# Patient Record
Sex: Male | Born: 1970 | Race: Black or African American | Hispanic: No | Marital: Married | Smoking: Former smoker
Health system: Southern US, Community
[De-identification: ages and names within clinical notes are randomized; demographics above are authoritative.]

## PROBLEM LIST (undated history)

## (undated) DIAGNOSIS — I10 Essential (primary) hypertension: Secondary | ICD-10-CM

## (undated) HISTORY — DX: Essential (primary) hypertension: I10

## (undated) HISTORY — PX: OTHER SURGICAL HISTORY: SHX169

---

## 1999-01-02 ENCOUNTER — Ambulatory Visit (HOSPITAL_COMMUNITY): Admission: RE | Admit: 1999-01-02 | Discharge: 1999-01-02 | Payer: Self-pay | Admitting: General Surgery

## 1999-01-02 ENCOUNTER — Encounter (HOSPITAL_BASED_OUTPATIENT_CLINIC_OR_DEPARTMENT_OTHER): Payer: Self-pay | Admitting: General Surgery

## 2005-09-08 ENCOUNTER — Observation Stay: Payer: Self-pay | Admitting: Otolaryngology

## 2006-10-18 ENCOUNTER — Emergency Department: Payer: Self-pay | Admitting: Unknown Physician Specialty

## 2007-11-26 ENCOUNTER — Ambulatory Visit: Payer: Self-pay | Admitting: Internal Medicine

## 2007-11-26 ENCOUNTER — Other Ambulatory Visit: Payer: Self-pay

## 2011-07-02 ENCOUNTER — Other Ambulatory Visit: Payer: Self-pay | Admitting: Internal Medicine

## 2011-07-02 MED ORDER — LISINOPRIL-HYDROCHLOROTHIAZIDE 20-25 MG PO TABS: 1.0000 | ORAL_TABLET | Freq: Every day | ORAL | Status: AC

## 2011-09-25 ENCOUNTER — Ambulatory Visit: Payer: Self-pay | Admitting: Internal Medicine

## 2011-10-14 ENCOUNTER — Encounter: Payer: Self-pay | Admitting: Internal Medicine

## 2011-10-14 ENCOUNTER — Ambulatory Visit (INDEPENDENT_AMBULATORY_CARE_PROVIDER_SITE_OTHER): Payer: Self-pay | Admitting: Internal Medicine

## 2011-10-14 VITALS — BP 160/92 | HR 72 | Temp 98.1°F | Ht 76.0 in | Wt 235.0 lb

## 2011-10-14 DIAGNOSIS — R635 Abnormal weight gain: Secondary | ICD-10-CM

## 2011-10-14 DIAGNOSIS — R634 Abnormal weight loss: Secondary | ICD-10-CM

## 2011-10-14 DIAGNOSIS — R5381 Other malaise: Secondary | ICD-10-CM

## 2011-10-14 DIAGNOSIS — E663 Overweight: Secondary | ICD-10-CM | POA: Insufficient documentation

## 2011-10-14 DIAGNOSIS — Z807 Family history of other malignant neoplasms of lymphoid, hematopoietic and related tissues: Secondary | ICD-10-CM | POA: Insufficient documentation

## 2011-10-14 DIAGNOSIS — R5383 Other fatigue: Secondary | ICD-10-CM

## 2011-10-14 DIAGNOSIS — I10 Essential (primary) hypertension: Secondary | ICD-10-CM

## 2011-10-14 LAB — CBC WITH DIFFERENTIAL/PLATELET
Basophils Absolute: 0 10*3/uL (ref 0.0–0.1)
Basophils Relative: 0.2 % (ref 0.0–3.0)
Eosinophils Absolute: 0.1 10*3/uL (ref 0.0–0.7)
Eosinophils Relative: 0.9 % (ref 0.0–5.0)
HCT: 46 % (ref 39.0–52.0)
Hemoglobin: 16.2 g/dL (ref 13.0–17.0)
Lymphocytes Relative: 36 % (ref 12.0–46.0)
Lymphs Abs: 2.5 10*3/uL (ref 0.7–4.0)
MCHC: 35.2 g/dL (ref 30.0–36.0)
MCV: 90.4 fl (ref 78.0–100.0)
Monocytes Absolute: 0.6 10*3/uL (ref 0.1–1.0)
Monocytes Relative: 8.8 % (ref 3.0–12.0)
Neutro Abs: 3.8 10*3/uL (ref 1.4–7.7)
Neutrophils Relative %: 54.1 % (ref 43.0–77.0)
Platelets: 191 10*3/uL (ref 150.0–400.0)
RBC: 5.09 Mil/uL (ref 4.22–5.81)
RDW: 12.8 % (ref 11.5–14.6)
WBC: 7 10*3/uL (ref 4.5–10.5)

## 2011-10-14 LAB — COMPREHENSIVE METABOLIC PANEL
ALT: 45 U/L (ref 0–53)
AST: 28 U/L (ref 0–37)
Albumin: 4.3 g/dL (ref 3.5–5.2)
Alkaline Phosphatase: 61 U/L (ref 39–117)
BUN: 12 mg/dL (ref 6–23)
CO2: 29 mEq/L (ref 19–32)
Calcium: 9 mg/dL (ref 8.4–10.5)
Chloride: 104 mEq/L (ref 96–112)
Creatinine, Ser: 1 mg/dL (ref 0.4–1.5)
GFR: 101.73 mL/min (ref >60.00)
Glucose, Bld: 85 mg/dL (ref 70–99)
Potassium: 3.8 mEq/L (ref 3.5–5.1)
Sodium: 140 mEq/L (ref 135–145)
Total Bilirubin: 1 mg/dL (ref 0.3–1.2)
Total Protein: 7.5 g/dL (ref 6.0–8.3)

## 2011-10-14 LAB — MICROALBUMIN / CREATININE URINE RATIO
Creatinine,U: 318.2 mg/dL
Microalb Creat Ratio: 0.3 mg/g (ref 0.0–30.0)
Microalb, Ur: 0.9 mg/dL (ref 0.0–1.9)

## 2011-10-14 LAB — LIPID PANEL
Cholesterol: 238 mg/dL — ABNORMAL HIGH (ref 0–200)
HDL: 44.9 mg/dL (ref >39.00)
Total CHOL/HDL Ratio: 5
Triglycerides: 133 mg/dL (ref 0.0–149.0)
VLDL: 26.6 mg/dL (ref 0.0–40.0)

## 2011-10-14 LAB — LDL CHOLESTEROL, DIRECT: Direct LDL: 166.6 mg/dL

## 2011-10-14 MED ORDER — AMLODIPINE BESYLATE 5 MG PO TABS: 5.0000 mg | ORAL_TABLET | Freq: Every day | ORAL | Status: DC

## 2011-10-14 NOTE — Assessment & Plan Note (Signed)
Chronic, with no meds in over 5 months.  Will start amlodipine pending review of CMET and urinalysis today.  Was taking lisinopril/hctz previously.

## 2011-10-14 NOTE — Assessment & Plan Note (Signed)
He has requested screening,  Will start with cbc and assessment of renal function.

## 2011-10-14 NOTE — Progress Notes (Signed)
Subjective:    Patient ID: Dylan Haynes, male    DOB: 1971-01-22, 41 y.o.   MRN: [id number]  HPI  Me. Crichlow is a healthy 41 yo AA male who has a history of hypertension and presents for one year followup.  He is a recent father of healthy 43 month old boy named Regulatory affairs officer.  Denies any recent home or work stressors.  Back to Selling  cars at [employer]  Not exercising due to work and baby schedule.   Has gained 20 lbs over the past 2 years.  He ran out of his medications in September.  Has no health insurance currently bc of the cost.  No new complaints,.  Past Medical History  Diagnosis Date  . Hypertension    Current Outpatient Prescriptions on File Prior to Visit  Medication Sig Dispense Refill  . lisinopril-hydrochlorothiazide (PRINZIDE,ZESTORETIC) 20-25 MG per tablet Take 1 tablet by mouth daily.  90 tablet  3    Review of Systems  Constitutional: Positive for unexpected weight change. Negative for fever, chills, diaphoresis, activity change, appetite change and fatigue.  HENT: Negative for hearing loss, ear pain, nosebleeds, congestion, sore throat, facial swelling, rhinorrhea, sneezing, drooling, mouth sores, trouble swallowing, neck pain, neck stiffness, dental problem, voice change, postnasal drip, sinus pressure, tinnitus and ear discharge.   Eyes: Negative for photophobia, pain, discharge, redness, itching and visual disturbance.  Respiratory: Negative for apnea, cough, choking, chest tightness, shortness of breath, wheezing and stridor.   Cardiovascular: Negative for chest pain, palpitations and leg swelling.  Gastrointestinal: Negative for nausea, vomiting, abdominal pain, diarrhea, constipation, blood in stool, abdominal distention, anal bleeding and rectal pain.  Genitourinary: Negative for dysuria, urgency, frequency, hematuria, flank pain, decreased urine volume, scrotal swelling, difficulty urinating and testicular pain.  Musculoskeletal: Negative for myalgias, back  pain, joint swelling, arthralgias and gait problem.  Skin: Negative for color change, rash and wound.  Neurological: Positive for tremors. Negative for dizziness, seizures, syncope, speech difficulty, weakness, light-headedness, numbness and headaches.  Psychiatric/Behavioral: Negative for suicidal ideas, hallucinations, behavioral problems, confusion, sleep disturbance, dysphoric mood, decreased concentration and agitation. The patient is not nervous/anxious.        BP 160/92  Pulse 72  Temp(Src) 98.1 F (36.7 C) (Oral)  Ht 6\' 4"  (1.93 m)  Wt 235 lb (106.595 kg)  BMI 28.61 kg/m2   Objective:   Physical Exam  Constitutional: He is oriented to person, place, and time.  HENT:  Head: Normocephalic and atraumatic.  Mouth/Throat: Oropharynx is clear and moist.  Eyes: Conjunctivae and EOM are normal.  Neck: Normal range of motion. Neck supple. No JVD present. No thyromegaly present.  Cardiovascular: Normal rate, regular rhythm and normal heart sounds.   Pulmonary/Chest: Effort normal and breath sounds normal. He has no wheezes. He has no rales.  Abdominal: Soft. Bowel sounds are normal. He exhibits no mass. There is no tenderness. There is no rebound.  Musculoskeletal: Normal range of motion. He exhibits no edema.  Neurological: He is alert and oriented to person, place, and time.  Skin: Skin is warm and dry.  Psychiatric: He has a normal mood and affect.      Assessment & Plan:   Hypertension Chronic, with no meds in over 5 months.  Will start amlodipine pending review of CMET and urinalysis today.  Was taking lisinopril/hctz previously.   Family history of multiple myeloma He has requested screening,  Will start with cbc and assessment of renal function.    Weight gaiin: secondary  to diet and inactivity .  Counselling given.  Updated Medication List Outpatient Encounter Prescriptions as of 10/14/2011  Medication Sig Dispense Refill  . amLODipine (NORVASC) 5 MG tablet Take 1  tablet (5 mg total) by mouth daily.  30 tablet  3  . lisinopril-hydrochlorothiazide (PRINZIDE,ZESTORETIC) 20-25 MG per tablet Take 1 tablet by mouth daily.  90 tablet  3

## 2011-10-14 NOTE — Patient Instructions (Addendum)
Try the EAS AdvantEdge proein shakes and the Atkins protein bars for breakfast and mid morning snacks  Recheck bp in 2 weeks ,  Goal 130/80 or less

## 2012-02-24 ENCOUNTER — Other Ambulatory Visit: Payer: Self-pay | Admitting: Internal Medicine

## 2012-05-06 ENCOUNTER — Encounter: Payer: Self-pay | Admitting: Internal Medicine

## 2012-07-20 ENCOUNTER — Other Ambulatory Visit: Payer: Self-pay | Admitting: Internal Medicine

## 2012-08-20 ENCOUNTER — Other Ambulatory Visit: Payer: Self-pay | Admitting: Internal Medicine

## 2012-08-20 NOTE — Telephone Encounter (Signed)
Patient needs an appointment

## 2012-09-24 ENCOUNTER — Other Ambulatory Visit: Payer: Self-pay | Admitting: Internal Medicine

## 2012-09-25 NOTE — Telephone Encounter (Signed)
Needs offive visit prior to additional refills

## 2012-11-13 ENCOUNTER — Other Ambulatory Visit: Payer: Self-pay

## 2012-11-17 ENCOUNTER — Other Ambulatory Visit: Payer: Self-pay | Admitting: Internal Medicine

## 2012-12-29 ENCOUNTER — Ambulatory Visit (INDEPENDENT_AMBULATORY_CARE_PROVIDER_SITE_OTHER): Payer: BC Managed Care – PPO | Admitting: Internal Medicine

## 2012-12-29 ENCOUNTER — Encounter: Payer: Self-pay | Admitting: Internal Medicine

## 2012-12-29 VITALS — BP 130/68 | HR 70 | Temp 97.8°F | Resp 18 | Ht 76.0 in | Wt 240.2 lb

## 2012-12-29 DIAGNOSIS — E785 Hyperlipidemia, unspecified: Secondary | ICD-10-CM

## 2012-12-29 DIAGNOSIS — Z0000 Encounter for general adult medical examination without abnormal findings: Secondary | ICD-10-CM

## 2012-12-29 DIAGNOSIS — I10 Essential (primary) hypertension: Secondary | ICD-10-CM

## 2012-12-29 LAB — LIPID PANEL
Cholesterol: 244 mg/dL — ABNORMAL HIGH (ref 0–200)
Total CHOL/HDL Ratio: 6
Triglycerides: 91 mg/dL (ref 0.0–149.0)

## 2012-12-29 LAB — CBC WITH DIFFERENTIAL/PLATELET
Basophils Relative: 0.3 % (ref 0.0–3.0)
Eosinophils Relative: 0.7 % (ref 0.0–5.0)
Hemoglobin: 15.6 g/dL (ref 13.0–17.0)
Lymphocytes Relative: 36.9 % (ref 12.0–46.0)
MCHC: 34.5 g/dL (ref 30.0–36.0)
Neutro Abs: 3.4 10*3/uL (ref 1.4–7.7)
Neutrophils Relative %: 53.9 % (ref 43.0–77.0)
RBC: 5.09 Mil/uL (ref 4.22–5.81)
WBC: 6.3 10*3/uL (ref 4.5–10.5)

## 2012-12-29 LAB — COMPREHENSIVE METABOLIC PANEL
Albumin: 4.4 g/dL (ref 3.5–5.2)
BUN: 13 mg/dL (ref 6–23)
CO2: 30 mEq/L (ref 19–32)
Calcium: 9.4 mg/dL (ref 8.4–10.5)
Chloride: 102 mEq/L (ref 96–112)
GFR: 85.63 mL/min (ref >60.00)
Glucose, Bld: 72 mg/dL (ref 70–99)
Potassium: 4.2 mEq/L (ref 3.5–5.1)
Sodium: 140 mEq/L (ref 135–145)
Total Protein: 7.7 g/dL (ref 6.0–8.3)

## 2012-12-29 MED ORDER — AMLODIPINE BESYLATE 5 MG PO TABS: 5.0000 mg | ORAL_TABLET | Freq: Every day | ORAL | Status: DC

## 2012-12-29 NOTE — Patient Instructions (Addendum)
We will call you with your labs   For more fiber try  Atkins bars  Mission low carb whole wheat tortillas

## 2012-12-29 NOTE — Progress Notes (Signed)
Patient ID: Dylan Haynes, male   DOB: 09-08-1971, 42 y.o.   MRN: [id number]  The patient is here for his annual male physical examination and management of other chronic and acute problems.   The risk factors are reflected in the social history.  The roster of all physicians providing medical care to patient - is listed in the Snapshot section of the chart.  Activities of daily living:  The patient is 100% independent in all ADLs: dressing, toileting, feeding as well as independent mobility  Home safety : The patient has smoke detectors in the home. He wears seatbelts.  There are no firearms at home. There is no violence in the home.   There is no risks for hepatitis, STDs or HIV. There is no   history of blood transfusion. There is no travel history to infectious disease endemic areas of the world.  The patient has seen their dentist in the last six month and  their eye doctor in the last year.  They do not  have excessive sun exposure. They have seen a dermatoloigist in the last year. Discussed the need for sun protection: hats, long sleeves and use of sunscreen if there is significant sun exposure.   Diet: the importance of a healthy diet is discussed. They do have a healthy diet.  The benefits of regular aerobic exercise were discussed. He exercises a minimum of 30 minutes  5 days per week. Depression screen: there are no signs or vegative symptoms of depression- irritability, change in appetite, anhedonia, sadness/tearfullness.  The following portions of the patient's history were reviewed and updated as appropriate: allergies, current medications, past family history, past medical history,  past surgical history, past social history  and problem list.  Visual acuity was not assessed per patient preference since he has regular follow up with his ophthalmologist. Hearing and body mass index were assessed and reviewed.   During the course of the visit the patient was educated and  counseled about appropriate screening and preventive services including :  nutrition counseling, colorectal cancer screening, and recommended immunizations.    Past Medical History  Diagnosis Date  . Hypertension     Review of Systems  Patient denies headache, fevers, malaise, unintentional weight loss, skin rash, eye pain, sinus congestion and sinus pain, sore throat, dysphagia,  hemoptysis , cough, dyspnea, wheezing, chest pain, palpitations, orthopnea, edema, abdominal pain, nausea, melena, diarrhea, constipation, flank pain, dysuria, hematuria, urinary  Frequency, nocturia, numbness, tingling, seizures,  Focal weakness, Loss of consciousness,  Tremor, insomnia, depression, anxiety, and suicidal ideation.    Objective  BP 130/68  Pulse 70  Temp(Src) 97.8 F (36.6 C) (Oral)  Resp 18  Ht 6\' 4"  (1.93 m)  Wt 240 lb 4 oz (108.977 kg)  BMI 29.26 kg/m2  SpO2 97%  General Appearance:    Alert, cooperative, no distress, appears stated age  Head:    Normocephalic, without obvious abnormality, atraumatic  Eyes:    PERRL, conjunctiva/corneas clear, EOM's intact, fundi    benign, both eyes       Ears:    Normal TM's and external ear canals, both ears  Nose:   Nares normal, septum midline, mucosa normal, no drainage   or sinus tenderness  Throat:   Lips, mucosa, and tongue normal; teeth and gums normal  Neck:   Supple, symmetrical, trachea midline, no adenopathy;       thyroid:  No enlargement/tenderness/nodules; no carotid   bruit or JVD  Back:  Symmetric, no curvature, ROM normal, no CVA tenderness  Lungs:     Clear to auscultation bilaterally, respirations unlabored  Chest wall:    No tenderness or deformity  Heart:    Regular rate and rhythm, S1 and S2 normal, no murmur, rub   or gallop  Abdomen:     Soft, non-tender, bowel sounds active all four quadrants,    no masses, no organomegaly  Genitalia:    Normal male without lesion, discharge or tenderness  Extremities:    Extremities normal, atraumatic, no cyanosis or edema  Pulses:   2+ and symmetric all extremities  Skin:   Skin color, texture, turgor normal, no rashes or lesions  Lymph nodes:   Cervical, supraclavicular, and axillary nodes normal  Neurologic:   CNII-XII intact. Normal strength, sensation and reflexes      throughout   Assessment and plan  Routine general medical examination at a health care facility Annual male exam was done including testicular exam.     Hypertension Well controlled on current regimen. Renal function stable, no changes today.  Other and unspecified hyperlipidemia recommending statin therapy if diet and exercise do not lower LDL to < 150 in 6 months.    Updated Medication List Outpatient Encounter Prescriptions as of 12/29/2012  Medication Sig Dispense Refill  . amLODipine (NORVASC) 5 MG tablet Take 1 tablet (5 mg total) by mouth daily.  90 tablet  3  . [DISCONTINUED] amLODipine (NORVASC) 5 MG tablet TAKE 1 TABLET BY MOUTH DAILY.  30 tablet  0   No facility-administered encounter medications on file as of 12/29/2012.

## 2012-12-30 ENCOUNTER — Encounter: Payer: Self-pay | Admitting: Internal Medicine

## 2012-12-30 DIAGNOSIS — E785 Hyperlipidemia, unspecified: Secondary | ICD-10-CM | POA: Insufficient documentation

## 2012-12-30 DIAGNOSIS — Z0000 Encounter for general adult medical examination without abnormal findings: Secondary | ICD-10-CM | POA: Insufficient documentation

## 2012-12-30 NOTE — Assessment & Plan Note (Signed)
Annual male exam was done including testicular exam.

## 2012-12-30 NOTE — Assessment & Plan Note (Signed)
recommending statin therapy if diet and exercise do not lower LDL to < 150 in 6 months.

## 2012-12-30 NOTE — Assessment & Plan Note (Signed)
Well controlled on current regimen. Renal function stable, no changes today. 

## 2013-01-03 ENCOUNTER — Encounter: Payer: Self-pay | Admitting: General Practice

## 2013-08-04 ENCOUNTER — Other Ambulatory Visit: Payer: Self-pay

## 2013-11-18 ENCOUNTER — Encounter: Payer: Self-pay | Admitting: Internal Medicine

## 2013-11-18 ENCOUNTER — Ambulatory Visit (INDEPENDENT_AMBULATORY_CARE_PROVIDER_SITE_OTHER): Payer: BC Managed Care – PPO | Admitting: Internal Medicine

## 2013-11-18 VITALS — BP 142/100 | HR 84 | Temp 98.2°F | Resp 18 | Wt 246.5 lb

## 2013-11-18 DIAGNOSIS — I10 Essential (primary) hypertension: Secondary | ICD-10-CM

## 2013-11-18 DIAGNOSIS — E785 Hyperlipidemia, unspecified: Secondary | ICD-10-CM

## 2013-11-18 DIAGNOSIS — R635 Abnormal weight gain: Secondary | ICD-10-CM

## 2013-11-18 MED ORDER — AMLODIPINE BESYLATE 10 MG PO TABS: 10.0000 mg | ORAL_TABLET | Freq: Every day | ORAL | Status: DC

## 2013-11-18 NOTE — Progress Notes (Signed)
Pre-visit discussion using our clinic review tool. No additional management support is needed unless otherwise documented below in the visit note.  

## 2013-11-18 NOTE — Patient Instructions (Signed)
I agree with your plan to drop weight to 225  (BMI 27)  Increase the amlodipine to 10 mg daily for now.   Return in one month for fasting labs   This is  One version of a  "Low GI"  Diet:  It will still lower your blood sugars and allow you to lose 4 to 8  lbs  per month if you follow it carefully.  Your goal with exercise is a minimum of 30 minutes of aerobic exercise 5 days per week (Walking does not count once it becomes easy!)    All of the foods can be found at grocery stores and in bulk at [employer].  The Atkins protein bars and shakes are available in more varieties at [employer], [employer] and [place].     7 AM Breakfast:  Choose from the following:  Low carbohydrate Protein  Shakes (I recommend the EAS AdvantEdge "Carb Control" shakes  Or the low carb shakes by Atkins.    2.5 carbs   Arnold's "Sandwhich Thin"toasted  w/ peanut butter (no jelly: about 20 net carbs)  "Bagel Thin" with cream cheese and salmon: about 20 carbs   a scrambled egg/bacon/cheese burrito made with Mission's "carb balance" whole wheat tortilla  (about 10 net carbs )   Avoid cereal and bananas, oatmeal and cream of wheat and grits. They are loaded with carbohydrates!   10 AM: high protein snack  Protein bar by Atkins (the snack size, under 200 cal, usually < 6 net carbs).    A stick of cheese:  Around 1 carb,  100 cal     Dannon Light n Fit [place] Yogurt  (80 cal, 8 carbs)  Other so called "protein bars" and Greek yogurts tend to be loaded with carbohydrates.  Remember, in food advertising, the word "energy" is synonymous for " carbohydrate."  Lunch:   A Sandwich using the bread choices listed, Can use any  Eggs,  lunchmeat, grilled meat or canned tuna), avocado, regular mayo/mustard  and cheese.  A Salad using blue cheese, ranch,  Goddess or vinagrette,  No croutons or "confetti" and no "candied nuts" but regular nuts OK.   No pretzels or chips.  Pickles and miniature sweet peppers are a good low carb  alternative that provide a "crunch"  The bread is the only source of carbohydrate in a sandwich and  can be decreased by trying some of these alternatives to traditional loaf bread  Joseph's makes a pita bread and a flat bread that are 50 cal and 4 net carbs available at [place] and [place].  This can be toasted to use with hummous as well  Toufayan makes a low carb flatbread that's 100 cal and 9 net carbs available at [employer] and [employer]'s makes 2 sizes of  Low carb whole wheat tortilla  (The large one is 210 cal and 6 net carbs) Avoid "Low fat dressings, as well as Barry Brunner and [place] dressings They are loaded with sugar!   3 PM/ Mid day  Snack:  Consider  1 ounce of  almonds, walnuts, pistachios, pecans, peanuts,  Macadamia nuts or a nut medley.  Avoid "granola"; the dried cranberries and raisins are loaded with carbohydrates. Mixed nuts as long as there are no raisins,  cranberries or dried fruit.     6 PM  Dinner:     Meat/fowl/fish with a green salad, and either broccoli, cauliflower, green beans, spinach, brussel sprouts or  Lima beans. DO  NOT BREAD THE PROTEIN!!      There is a low carb pasta by Dreamfield's that is acceptable and tastes great: only 5 digestible carbs/serving.( All grocery stores but BJs carry it )  Try Hurley Cisco Angelo's chicken piccata or chicken or eggplant parm over low carb pasta.(Lowes and BJs)   Marjory Lies Sanchez's "Carnitas" (pulled pork, no sauce,  0 carbs) or his beef pot roast to make a dinner burrito (at [employer]J's)  Pesto over low carb pasta (bj's sells a good quality pesto in the center refrigerated section of the deli   Whole wheat pasta is still full of digestible carbs and  Not as low in glycemic index as Dreamfield's.   Brown rice is still rice,  So skip the rice and noodles if you eat [place] or [place] (or at least limit to 1/2 cup)  9 PM snack :   Breyer's "low carb" fudgsicle or  ice cream bar (Carb Smart line), or  Weight Watcher's ice cream bar ,  or another "no sugar added" ice cream;  a serving of fresh berries/cherries with whipped cream   Cheese or DANNON'S [place] YOGURT  Avoid bananas, pineapple, grapes  and watermelon on a regular basis because they are high in sugar.  THINK OF THEM AS DESSERT  Remember that snack Substitutions should be less than 10 NET carbs per serving and meals < 20 carbs. Remember to subtract fiber grams to get the "net carbs."

## 2013-11-18 NOTE — Assessment & Plan Note (Signed)
I have addressed  BMI  Of 30 and recommended wt loss of 10% of body weigh over the next 6 months using a low glycemic index diet and regular exercise a minimum of 5 days per week.

## 2013-11-20 NOTE — Assessment & Plan Note (Addendum)
Using last year's lipids and the Framingham risk calculator,  his 10 year risk of coronary artery disease is 13%.  Repeat lipids will be done and statin recommended if still indicated.    Lab Results  Component Value Date   CHOL 244* 12/29/2012   HDL 42.10 12/29/2012   LDLDIRECT 188.4 12/29/2012   TRIG 91.0 12/29/2012   CHOLHDL 6 12/29/2012

## 2013-11-20 NOTE — Assessment & Plan Note (Signed)
Well controlled on current regimen. Renal function due.,   no changes today. 

## 2013-11-20 NOTE — Progress Notes (Signed)
Patient ID: Dylan Haynes, male   DOB: 10/26/1970, 43 y.o.   MRN: [id number]  Patient Active Problem List   Diagnosis Date Noted  . Routine general medical examination at a health care facility 12/30/2012  . Other and unspecified hyperlipidemia 12/30/2012  . Family history of multiple myeloma 10/14/2011  . Weight gain 10/14/2011  . Hypertension     Subjective:  CC:   Chief Complaint  Patient presents with  . Follow-up    Discuss family HX heart disease, BP and cholesterol    HPI:   Dylan Haynes is a 43 y.o. male who presents for 6 month follow up on hypertension, hyperlipidemia and weight gain.  He is not exercising due to work and home responsibilities. .  He has gained 5 lbs in the past year .    Past Medical History  Diagnosis Date  . Hypertension     No past surgical history on file.     The following portions of the patient's history were reviewed and updated as appropriate: Allergies, current medications, and problem list.    Review of Systems:   Patient denies headache, fevers, malaise, unintentional weight loss, skin rash, eye pain, sinus congestion and sinus pain, sore throat, dysphagia,  hemoptysis , cough, dyspnea, wheezing, chest pain, palpitations, orthopnea, edema, abdominal pain, nausea, melena, diarrhea, constipation, flank pain, dysuria, hematuria, urinary  Frequency, nocturia, numbness, tingling, seizures,  Focal weakness, Loss of consciousness,  Tremor, insomnia, depression, anxiety, and suicidal ideation.     History   Social History  . Marital Status: Divorced    Spouse Name: N/A    Number of Children: N/A  . Years of Education: N/A   Occupational History  . Not on file.   Social History Main Topics  . Smoking status: Former Smoker    Types: Cigars  . Smokeless tobacco: Not on file  . Alcohol Use: Not on file  . Drug Use: Not on file  . Sexual Activity: Not on file   Other Topics Concern  . Not on file   Social History  Narrative  . No narrative on file    Objective:  Filed Vitals:   11/18/13 1042  BP: 142/100  Pulse: 84  Temp: 98.2 F (36.8 C)  Resp: 18     General appearance: alert, cooperative and appears stated age Ears: normal TM's and external ear canals both ears Throat: lips, mucosa, and tongue normal; teeth and gums normal Neck: no adenopathy, no carotid bruit, supple, symmetrical, trachea midline and thyroid not enlarged, symmetric, no tenderness/mass/nodules Back: symmetric, no curvature. ROM normal. No CVA tenderness. Lungs: clear to auscultation bilaterally Heart: regular rate and rhythm, S1, S2 normal, no murmur, click, rub or gallop Abdomen: soft, non-tender; bowel sounds normal; no masses,  no organomegaly Pulses: 2+ and symmetric Skin: Skin color, texture, turgor normal. No rashes or lesions Lymph nodes: Cervical, supraclavicular, and axillary nodes normal.  Assessment and Plan:  Weight gain I have addressed  BMI  Of 30 and recommended wt loss of 10% of body weight over the next 6 months using a low glycemic index diet and regular exercise a minimum of 5 days per week.    Other and unspecified hyperlipidemia Using last year's lipids and the Framingham risk calculator,  his 10 year risk of coronary artery disease is 13%.  Repeat lipids will be done and statin recommended if still indicated.    Lab Results  Component Value Date   CHOL 244* 12/29/2012   HDL  42.10 12/29/2012   LDLDIRECT 188.4 12/29/2012   TRIG 91.0 12/29/2012   CHOLHDL 6 12/29/2012    Hypertension Well controlled on current regimen. Renal function due, no changes today.   Updated Medication List Outpatient Encounter Prescriptions as of 11/18/2013  Medication Sig  . amLODipine (NORVASC) 10 MG tablet Take 1 tablet (10 mg total) by mouth daily.  . [DISCONTINUED] amLODipine (NORVASC) 5 MG tablet Take 1 tablet (5 mg total) by mouth daily.     Orders Placed This Encounter  Procedures  . Comprehensive metabolic  panel  . Lipid panel  . Microalbumin / creatinine urine ratio  . TSH    No Follow-up on file.       

## 2013-11-21 ENCOUNTER — Telehealth: Payer: Self-pay | Admitting: Internal Medicine

## 2013-11-21 NOTE — Telephone Encounter (Signed)
Relevant patient education assigned to patient using Emmi. ° °

## 2013-12-16 ENCOUNTER — Other Ambulatory Visit: Payer: BC Managed Care – PPO

## 2014-11-27 ENCOUNTER — Encounter: Payer: Self-pay | Admitting: Internal Medicine

## 2015-01-01 ENCOUNTER — Telehealth: Payer: Self-pay

## 2015-01-01 DIAGNOSIS — I10 Essential (primary) hypertension: Secondary | ICD-10-CM

## 2015-01-01 MED ORDER — AMLODIPINE BESYLATE 10 MG PO TABS: 10.0000 mg | ORAL_TABLET | Freq: Every day | ORAL | Status: DC

## 2015-01-01 NOTE — Telephone Encounter (Signed)
OK to refill BP medication for 30 days, patient not seen in 1 year, has an appointment on 01/22/15 for CPE.

## 2015-01-01 NOTE — Telephone Encounter (Signed)
Refill one 30 days only.  rx sent

## 2015-01-01 NOTE — Telephone Encounter (Signed)
The patient's wife called hoping to get a refill on the pt's blood pressure medication to last until his cpe apt in a few weeks.

## 2015-01-22 ENCOUNTER — Encounter: Payer: Self-pay | Admitting: Internal Medicine

## 2015-01-22 ENCOUNTER — Ambulatory Visit (INDEPENDENT_AMBULATORY_CARE_PROVIDER_SITE_OTHER): Payer: BLUE CROSS/BLUE SHIELD | Admitting: Internal Medicine

## 2015-01-22 VITALS — BP 136/100 | HR 82 | Temp 98.5°F | Resp 16 | Ht 76.0 in | Wt 243.8 lb

## 2015-01-22 DIAGNOSIS — Z125 Encounter for screening for malignant neoplasm of prostate: Secondary | ICD-10-CM

## 2015-01-22 DIAGNOSIS — R030 Elevated blood-pressure reading, without diagnosis of hypertension: Secondary | ICD-10-CM

## 2015-01-22 DIAGNOSIS — R5383 Other fatigue: Secondary | ICD-10-CM | POA: Diagnosis not present

## 2015-01-22 DIAGNOSIS — E785 Hyperlipidemia, unspecified: Secondary | ICD-10-CM | POA: Diagnosis not present

## 2015-01-22 DIAGNOSIS — IMO0001 Reserved for inherently not codable concepts without codable children: Secondary | ICD-10-CM

## 2015-01-22 DIAGNOSIS — N486 Induration penis plastica: Secondary | ICD-10-CM | POA: Diagnosis not present

## 2015-01-22 DIAGNOSIS — I10 Essential (primary) hypertension: Secondary | ICD-10-CM

## 2015-01-22 DIAGNOSIS — Z0000 Encounter for general adult medical examination without abnormal findings: Secondary | ICD-10-CM | POA: Diagnosis not present

## 2015-01-22 DIAGNOSIS — Z23 Encounter for immunization: Secondary | ICD-10-CM

## 2015-01-22 LAB — COMPREHENSIVE METABOLIC PANEL
ALK PHOS: 70 U/L (ref 39–117)
ALT: 28 U/L (ref 0–53)
AST: 19 U/L (ref 0–37)
Albumin: 4.8 g/dL (ref 3.5–5.2)
BUN: 13 mg/dL (ref 6–23)
CALCIUM: 9.9 mg/dL (ref 8.4–10.5)
CHLORIDE: 103 meq/L (ref 96–112)
CO2: 29 mEq/L (ref 19–32)
Creatinine, Ser: 1.16 mg/dL (ref 0.40–1.50)
GFR: 87.36 mL/min (ref >60.00)
Glucose, Bld: 85 mg/dL (ref 70–99)
Potassium: 4 mEq/L (ref 3.5–5.1)
Sodium: 138 mEq/L (ref 135–145)
Total Bilirubin: 1.1 mg/dL (ref 0.2–1.2)
Total Protein: 7.8 g/dL (ref 6.0–8.3)

## 2015-01-22 LAB — MICROALBUMIN / CREATININE URINE RATIO
Creatinine,U: 628.5 mg/dL
MICROALB UR: 1.8 mg/dL (ref 0.0–1.9)
MICROALB/CREAT RATIO: 0.3 mg/g (ref 0.0–30.0)

## 2015-01-22 LAB — CBC WITH DIFFERENTIAL/PLATELET
BASOS PCT: 0.4 % (ref 0.0–3.0)
Basophils Absolute: 0 10*3/uL (ref 0.0–0.1)
EOS PCT: 0.8 % (ref 0.0–5.0)
Eosinophils Absolute: 0.1 10*3/uL (ref 0.0–0.7)
HCT: 49.7 % (ref 39.0–52.0)
Hemoglobin: 16.9 g/dL (ref 13.0–17.0)
LYMPHS PCT: 36.3 % (ref 12.0–46.0)
Lymphs Abs: 2.7 10*3/uL (ref 0.7–4.0)
MCHC: 34 g/dL (ref 30.0–36.0)
MCV: 88.4 fl (ref 78.0–100.0)
MONOS PCT: 8.5 % (ref 3.0–12.0)
Monocytes Absolute: 0.6 10*3/uL (ref 0.1–1.0)
NEUTROS PCT: 54 % (ref 43.0–77.0)
Neutro Abs: 4.1 10*3/uL (ref 1.4–7.7)
Platelets: 197 10*3/uL (ref 150.0–400.0)
RBC: 5.61 Mil/uL (ref 4.22–5.81)
RDW: 13.3 % (ref 11.5–15.5)
WBC: 7.6 10*3/uL (ref 4.0–10.5)

## 2015-01-22 LAB — LIPID PANEL
CHOL/HDL RATIO: 6
Cholesterol: 258 mg/dL — ABNORMAL HIGH (ref 0–200)
HDL: 45.2 mg/dL (ref >39.00)
LDL Cholesterol: 193 mg/dL — ABNORMAL HIGH (ref 0–99)
NONHDL: 212.8
TRIGLYCERIDES: 99 mg/dL (ref 0.0–149.0)
VLDL: 19.8 mg/dL (ref 0.0–40.0)

## 2015-01-22 LAB — TSH: TSH: 1.22 u[IU]/mL (ref 0.35–4.50)

## 2015-01-22 LAB — PSA: PSA: 1.52 ng/mL (ref 0.10–4.00)

## 2015-01-22 NOTE — Progress Notes (Signed)
Pre-visit discussion using our clinic review tool. No additional management support is needed unless otherwise documented below in the visit note.  

## 2015-01-22 NOTE — Patient Instructions (Addendum)
I am referring you to [facility] for your penile problems,  which is called Peyronie's Disease  It is NOT CANCER ..it is a chronic condition that causes curvature of the shaft and may be due to prior trauma    Your blood pressure is very elevated today,  It may be white coat hypertension.  Please recheck it once eery few weeks anda semd ne the readings  It should be < 130/80 .    Health Maintenance A healthy lifestyle and preventative care can promote health and wellness.  Maintain regular health, dental, and eye exams.  Eat a healthy diet. Foods like vegetables, fruits, whole grains, low-fat dairy products, and lean protein foods contain the nutrients you need and are low in calories. Decrease your intake of foods high in solid fats, added sugars, and salt. Get information about a proper diet from your health care provider, if necessary.  Regular physical exercise is one of the most important things you can do for your health. Most adults should get at least 150 minutes of moderate-intensity exercise (any activity that increases your heart rate and causes you to sweat) each week. In addition, most adults need muscle-strengthening exercises on 2 or more days a week.   Maintain a healthy weight. The body mass index (BMI) is a screening tool to identify possible weight problems. It provides an estimate of body fat based on height and weight. Your health care provider can find your BMI and can help you achieve or maintain a healthy weight. For males 20 years and older:  A BMI below 18.5 is considered underweight.  A BMI of 18.5 to 24.9 is normal.  A BMI of 25 to 29.9 is considered overweight.  A BMI of 30 and above is considered obese.  Maintain normal blood lipids and cholesterol by exercising and minimizing your intake of saturated fat. Eat a balanced diet with plenty of fruits and vegetables. Blood tests for lipids and cholesterol should begin at age 54 and be repeated every 5  years. If your lipid or cholesterol levels are high, you are over age 23, or you are at high risk for heart disease, you may need your cholesterol levels checked more frequently.Ongoing high lipid and cholesterol levels should be treated with medicines if diet and exercise are not working.  If you smoke, find out from your health care provider how to quit. If you do not use tobacco, do not start.  Lung cancer screening is recommended for adults aged 27-80 years who are at high risk for developing lung cancer because of a history of smoking. A yearly low-dose CT scan of the lungs is recommended for people who have at least a 30-pack-year history of smoking and are current smokers or have quit within the past 15 years. A pack year of smoking is smoking an average of 1 pack of cigarettes a day for 1 year (for example, a 30-pack-year history of smoking could mean smoking 1 pack a day for 30 years or 2 packs a day for 15 years). Yearly screening should continue until the smoker has stopped smoking for at least 15 years. Yearly screening should be stopped for people who develop a health problem that would prevent them from having lung cancer treatment.  If you choose to drink alcohol, do not have more than 2 drinks per day. One drink is considered to be 12 oz (360 mL) of beer, 5 oz (150 mL) of wine, or 1.5 oz (45 mL) of liquor.  Avoid the use of street drugs. Do not share needles with anyone. Ask for help if you need support or instructions about stopping the use of drugs.  High blood pressure causes heart disease and increases the risk of stroke. Blood pressure should be checked at least every 1-2 years. Ongoing high blood pressure should be treated with medicines if weight loss and exercise are not effective.  If you are 40-69 years old, ask your health care provider if you should take aspirin to prevent heart disease.  Diabetes screening involves taking a blood sample to check your fasting blood sugar  level. This should be done once every 3 years after age 55 if you are at a normal weight and without risk factors for diabetes. Testing should be considered at a younger age or be carried out more frequently if you are overweight and have at least 1 risk factor for diabetes.  Colorectal cancer can be detected and often prevented. Most routine colorectal cancer screening begins at the age of 24 and continues through age 32. However, your health care provider may recommend screening at an earlier age if you have risk factors for colon cancer. On a yearly basis, your health care provider may provide home test kits to check for hidden blood in the stool. A small camera at the end of a tube may be used to directly examine the colon (sigmoidoscopy or colonoscopy) to detect the earliest forms of colorectal cancer. Talk to your health care provider about this at age 27 when routine screening begins. A direct exam of the colon should be repeated every 5-10 years through age 60, unless early forms of precancerous polyps or small growths are found.  People who are at an increased risk for hepatitis B should be screened for this virus. You are considered at high risk for hepatitis B if:  You were born in a country where hepatitis B occurs often. Talk with your health care provider about which countries are considered high risk.  Your parents were born in a high-risk country and you have not received a shot to protect against hepatitis B (hepatitis B vaccine).  You have HIV or AIDS.  You use needles to inject street drugs.  You live with, or have sex with, someone who has hepatitis B.  You are a man who has sex with other men (MSM).  You get hemodialysis treatment.  You take certain medicines for conditions like cancer, organ transplantation, and autoimmune conditions.  Hepatitis C blood testing is recommended for all people born from [birth year] through 1967 and any individual with known risk factors for  hepatitis C.  Healthy men should no longer receive prostate-specific antigen (PSA) blood tests as part of routine cancer screening. Talk to your health care provider about prostate cancer screening.  Testicular cancer screening is not recommended for adolescents or adult males who have no symptoms. Screening includes self-exam, a health care provider exam, and other screening tests. Consult with your health care provider about any symptoms you have or any concerns you have about testicular cancer.  Practice safe sex. Use condoms and avoid high-risk sexual practices to reduce the spread of sexually transmitted infections (STIs).  You should be screened for STIs, including gonorrhea and chlamydia if:  You are sexually active and are younger than 24 years.  You are older than 24 years, and your health care provider tells you that you are at risk for this type of infection.  Your sexual activity has changed since you  were last screened, and you are at an increased risk for chlamydia or gonorrhea. Ask your health care provider if you are at risk.  If you are at risk of being infected with HIV, it is recommended that you take a prescription medicine daily to prevent HIV infection. This is called pre-exposure prophylaxis (PrEP). You are considered at risk if:  You are a man who has sex with other men (MSM).  You are a heterosexual man who is sexually active with multiple partners.  You take drugs by injection.  You are sexually active with a partner who has HIV.  Talk with your health care provider about whether you are at high risk of being infected with HIV. If you choose to begin PrEP, you should first be tested for HIV. You should then be tested every 3 months for as long as you are taking PrEP.  Use sunscreen. Apply sunscreen liberally and repeatedly throughout the day. You should seek shade when your shadow is shorter than you. Protect yourself by wearing long sleeves, pants, a  wide-brimmed hat, and sunglasses year round whenever you are outdoors.  Tell your health care provider of new moles or changes in moles, especially if there is a change in shape or color. Also, tell your health care provider if a mole is larger than the size of a pencil eraser.  A one-time screening for abdominal aortic aneurysm (AAA) and surgical repair of large AAAs by ultrasound is recommended for men aged 61-75 years who are current or former smokers.  Stay current with your vaccines (immunizations). Document Released: 03/13/2008 Document Revised: 09/20/2013 Document Reviewed: 02/10/2011 [facility] Patient Information 2015 [place], [place]. This information is not intended to replace advice given to you by your health care provider. Make sure you discuss any questions you have with your health care provider.

## 2015-01-22 NOTE — Progress Notes (Signed)
Patient ID: Dylan Haynes, male   DOB: [birth year], 44 y.o.   MRN: [id number]    The patient is here for his annual  physical examination and management of other chronic and acute problems. He has been exercising less due to the increasing responsibilities at work and at home with 4 growing boys.   He has noticed that his penile shaft has taken on a slight curved orientation to the right with erections.  It is not interfering with intercourse,  Is not painful, and not associated with urinary glow changes.  He recalls that he nay have been struck in the groin once or twice with a basketball in his younger days while playing, but other than that has no history of penile trauma and no history of STD.    The risk factors are reflected in the social history.  The roster of all physicians providing medical care to patient - is listed in the Snapshot section of the chart.  Activities of daily living:  The patient is 100% independent in all ADLs: dressing, toileting, feeding as well as independent mobility  Home safety : The patient has smoke detectors in the home. He wears seatbelts.  There are no firearms at home. There is no violence in the home.   There is no risks for hepatitis, STDs or HIV. There is no   history of blood transfusion. There is no travel history to infectious disease endemic areas of the world.  The patient has seen their dentist in the last six month and  their eye doctor in the last year.  They do not  have excessive sun exposure. They have seen a dermatoloigist in the last year. Discussed the need for sun protection: hats, long sleeves and use of sunscreen if there is significant sun exposure.   Diet: the importance of a healthy diet is discussed. They do have a healthy diet.  The benefits of regular aerobic exercise were discussed. He exercises about 2 times per  week. Depression screen: there are no signs or vegative symptoms of depression- irritability, change in appetite,  anhedonia, sadness/tearfullness.  The following portions of the patient's history were reviewed and updated as appropriate: allergies, current medications, past family history, past medical history,  past surgical history, past social history  and problem list.  Visual acuity was not assessed per patient preference since he has regular follow up with his ophthalmologist. Hearing and body mass index were assessed and reviewed.   During the course of the visit the patient was educated and counseled about appropriate screening and preventive services including :  nutrition counseling, colorectal cancer screening, and recommended immunizations.    Review of Systems:  Patient denies headache, fevers, malaise, unintentional weight loss, skin rash, eye pain, sinus congestion and sinus pain, sore throat, dysphagia,  hemoptysis , cough, dyspnea, wheezing, chest pain, palpitations, orthopnea, edema, abdominal pain, nausea, melena, diarrhea, constipation, flank pain, dysuria, hematuria, urinary  Frequency, nocturia, numbness, tingling, seizures,  Focal weakness, Loss of consciousness,  Tremor, insomnia, depression, anxiety, and suicidal ideation.     Objective:  BP 136/100 mmHg  Pulse 82  Temp(Src) 98.5 F (36.9 C) (Oral)  Resp 16  Ht 6\' 4"  (1.93 m)  Wt 243 lb 12 oz (110.564 kg)  BMI 29.68 kg/m2  SpO2 97%  General Appearance:    Alert, cooperative, no distress, appears stated age  Head:    Normocephalic, without obvious abnormality, atraumatic  Eyes:    PERRL, conjunctiva/corneas clear, EOM's intact, fundi  benign, both eyes       Ears:    Normal TM's and external ear canals, both ears  Nose:   Nares normal, septum midline, mucosa normal, no drainage   or sinus tenderness  Throat:   Lips, mucosa, and tongue normal; teeth and gums normal  Neck:   Supple, symmetrical, trachea midline, no adenopathy;       thyroid:  No enlargement/tenderness/nodules; no carotid   bruit or JVD  Back:      Symmetric, no curvature, ROM normal, no CVA tenderness  Lungs:     Clear to auscultation bilaterally, respirations unlabored  Chest wall:    No tenderness or deformity  Heart:    Regular rate and rhythm, S1 and S2 normal, no murmur, rub   or gallop  Abdomen:     Soft, non-tender, bowel sounds active all four quadrants,    no masses, no organomegaly  Genitalia:    Normal male without lesion, discharge or tenderness  Rectal:    Normal tone, normal prostate, no masses or tenderness;   guaiac negative stool  Extremities:   Extremities normal, atraumatic, no cyanosis or edema  Pulses:   2+ and symmetric all extremities  Skin:   Skin color, texture, turgor normal, no rashes or lesions  Lymph nodes:   Cervical, supraclavicular, and axillary nodes normal  Neurologic:   CNII-XII intact. Normal strength, sensation and reflexes      throughout   Assessment and Plan:  Problem List Items Addressed This Visit    Hypertension    Patient has not been see in a year for hypertension follow up and his last assessment of renal function was a year ago.  Advised her that [facility] is q 6 month follow up with renal function assessment.  Since his BP is elevated today I have asked him to have BP checked 3 times over the next month and report readings to me       Relevant Medications   atorvastatin (LIPITOR) 20 MG tablet   Routine general medical examination at a health care facility - Primary    Annual wellness  exam was done as well as a comprehensive physical exam and management of acute and chronic conditions .  During the course of the visit the patient was educated and counseled about appropriate screening and preventive services including :  diabetes screening, lipid analysis with projected  10 year  risk for CAD , nutrition counseling, colorectal cancer screening, and recommended immunizations.  Printed recommendations for health maintenance screenings was given.       Hyperlipidemia    Based on current  lipid profile, the risk of clinically significant CAD is 12 over the  next 10 years, using the Framingham risk calculator. The [employer] recommends starting patients aged 33 or higher on moderate intensity statin therapy for LDL between 70-189 and 10 yr risk of CAD > 7.5% ;  I am recommending statin therapy with lipitor 20 mg daily and return for repeat labs in 6 weeks  Lab Results  Component Value Date   CHOL 258* 01/22/2015   HDL 45.20 01/22/2015   LDLCALC 193* 01/22/2015   LDLDIRECT 188.4 12/29/2012   TRIG 99.0 01/22/2015   CHOLHDL 6 01/22/2015         Relevant Medications   atorvastatin (LIPITOR) 20 MG tablet   Other Relevant Orders   Lipid panel (Completed)   Peyronie disease    Suggested by patient's observation.  Diagnosis discussed and reassurance provided  that CA is not part of the history or prognosis, but that progressive changes leading to discomfort are posbbile.  Referral to [facility] offered.       Relevant Orders   Ambulatory referral to Urology    Other Visit Diagnoses    Screening for prostate cancer        Relevant Orders    PSA (Completed)    Other fatigue        Relevant Orders    CBC with Differential/Platelet (Completed)    TSH (Completed)    HTN, white coat        Relevant Medications    atorvastatin (LIPITOR) 20 MG tablet    Other Relevant Orders    Comprehensive metabolic panel (Completed)    Microalbumin / creatinine urine ratio (Completed)    Need for prophylactic vaccination with combined diphtheria-tetanus-pertussis (DTP) vaccine        Relevant Orders    Tdap vaccine greater than or equal to 7yo IM (Completed)

## 2015-01-23 ENCOUNTER — Encounter: Payer: Self-pay | Admitting: Internal Medicine

## 2015-01-23 MED ORDER — ATORVASTATIN CALCIUM 20 MG PO TABS: 20.0000 mg | ORAL_TABLET | Freq: Every day | ORAL | Status: DC

## 2015-01-23 NOTE — Assessment & Plan Note (Addendum)
Based on current lipid profile, the risk of clinically significant CAD is 12 over the  next 10 years, using the Framingham risk calculator. The [employer] recommends starting patients aged 46 or higher on moderate intensity statin therapy for LDL between 70-189 and 10 yr risk of CAD > 7.5% ;  I am recommending statin therapy with lipitor 20 mg daily and return for repeat labs in 6 weeks  Lab Results  Component Value Date   CHOL 258* 01/22/2015   HDL 45.20 01/22/2015   LDLCALC 193* 01/22/2015   LDLDIRECT 188.4 12/29/2012   TRIG 99.0 01/22/2015   CHOLHDL 6 01/22/2015

## 2015-01-23 NOTE — Assessment & Plan Note (Signed)

## 2015-01-23 NOTE — Assessment & Plan Note (Addendum)
Patient has not been see in a year for hypertension follow up and his last assessment of renal function was a year ago.  Advised her that [facility] is q 6 month follow up with renal function assessment.  Since his BP is elevated today I have asked him to have BP checked 3 times over the next month and report readings to me

## 2015-01-23 NOTE — Assessment & Plan Note (Signed)
Suggested by patient's observation.  Diagnosis discussed and reassurance provided that CA is not part of the history or prognosis, but that progressive changes leading to discomfort are posbbile.  Referral to [facility] offered.

## 2015-01-27 NOTE — Addendum Note (Signed)
Addended by: Crecencio Mc on: 01/27/2015 03:27 PM   Modules accepted: Level of Service

## 2015-02-06 ENCOUNTER — Other Ambulatory Visit: Payer: Self-pay | Admitting: Internal Medicine

## 2015-02-16 NOTE — Telephone Encounter (Signed)
Mailed unread message to pt  

## 2015-07-25 ENCOUNTER — Ambulatory Visit: Payer: BLUE CROSS/BLUE SHIELD | Admitting: Internal Medicine

## 2015-07-28 NOTE — Assessment & Plan Note (Signed)
Patient failed to keep scheduled appointment and will be charged a no show fee.   

## 2015-07-28 NOTE — Progress Notes (Signed)
Patient failed to keep scheduled appointment and will be charged a no show fee.   

## 2015-08-13 ENCOUNTER — Other Ambulatory Visit: Payer: Self-pay | Admitting: Internal Medicine

## 2016-09-15 ENCOUNTER — Other Ambulatory Visit: Payer: Self-pay | Admitting: Internal Medicine

## 2016-09-16 NOTE — Telephone Encounter (Signed)
Pt last refill for Norvasc was on 08/13/15. Pt last labs and last OV was on 01/22/15 and no showed appt on 07/25/15. PT has scheduled appt for this Thursday 09/18/16. Ok to refill Norvasc?

## 2016-09-18 ENCOUNTER — Encounter: Payer: Self-pay | Admitting: Internal Medicine

## 2016-09-18 ENCOUNTER — Ambulatory Visit (INDEPENDENT_AMBULATORY_CARE_PROVIDER_SITE_OTHER): Payer: 59 | Admitting: Internal Medicine

## 2016-09-18 VITALS — BP 160/98 | HR 68 | Temp 98.0°F | Resp 16 | Ht 75.75 in | Wt 251.5 lb

## 2016-09-18 DIAGNOSIS — R635 Abnormal weight gain: Secondary | ICD-10-CM

## 2016-09-18 DIAGNOSIS — E785 Hyperlipidemia, unspecified: Secondary | ICD-10-CM | POA: Diagnosis not present

## 2016-09-18 DIAGNOSIS — E7800 Pure hypercholesterolemia, unspecified: Secondary | ICD-10-CM | POA: Diagnosis not present

## 2016-09-18 DIAGNOSIS — E559 Vitamin D deficiency, unspecified: Secondary | ICD-10-CM

## 2016-09-18 DIAGNOSIS — Z0000 Encounter for general adult medical examination without abnormal findings: Secondary | ICD-10-CM

## 2016-09-18 DIAGNOSIS — Z125 Encounter for screening for malignant neoplasm of prostate: Secondary | ICD-10-CM | POA: Diagnosis not present

## 2016-09-18 DIAGNOSIS — R5383 Other fatigue: Secondary | ICD-10-CM | POA: Diagnosis not present

## 2016-09-18 DIAGNOSIS — I10 Essential (primary) hypertension: Secondary | ICD-10-CM | POA: Diagnosis not present

## 2016-09-18 LAB — MICROALBUMIN / CREATININE URINE RATIO
CREATININE, U: 361.2 mg/dL
MICROALB UR: 2.7 mg/dL — AB (ref 0.0–1.9)
MICROALB/CREAT RATIO: 0.7 mg/g (ref 0.0–30.0)

## 2016-09-18 LAB — URINALYSIS, ROUTINE W REFLEX MICROSCOPIC
Bilirubin Urine: NEGATIVE
Ketones, ur: NEGATIVE
Leukocytes, UA: NEGATIVE
Nitrite: NEGATIVE
PH: 5.5 (ref 5.0–8.0)
Specific Gravity, Urine: >=1.03 — AB (ref 1.000–1.030)
TOTAL PROTEIN, URINE-UPE24: NEGATIVE
Urine Glucose: NEGATIVE
Urobilinogen, UA: 0.2 (ref 0.0–1.0)

## 2016-09-18 LAB — CBC WITH DIFFERENTIAL/PLATELET
Basophils Absolute: 0 10*3/uL (ref 0.0–0.1)
Basophils Relative: 0.4 % (ref 0.0–3.0)
EOS ABS: 0.1 10*3/uL (ref 0.0–0.7)
Eosinophils Relative: 1.2 % (ref 0.0–5.0)
HCT: 46.4 % (ref 39.0–52.0)
Hemoglobin: 15.8 g/dL (ref 13.0–17.0)
LYMPHS ABS: 2.5 10*3/uL (ref 0.7–4.0)
Lymphocytes Relative: 33.8 % (ref 12.0–46.0)
MCHC: 34.1 g/dL (ref 30.0–36.0)
MCV: 89.2 fl (ref 78.0–100.0)
Monocytes Absolute: 0.6 10*3/uL (ref 0.1–1.0)
Monocytes Relative: 8.3 % (ref 3.0–12.0)
NEUTROS PCT: 56.3 % (ref 43.0–77.0)
Neutro Abs: 4.2 10*3/uL (ref 1.4–7.7)
Platelets: 185 10*3/uL (ref 150.0–400.0)
RBC: 5.2 Mil/uL (ref 4.22–5.81)
RDW: 13.3 % (ref 11.5–15.5)
WBC: 7.5 10*3/uL (ref 4.0–10.5)

## 2016-09-18 LAB — LIPID PANEL
CHOLESTEROL: 231 mg/dL — AB (ref 0–200)
HDL: 44.4 mg/dL (ref >39.00)
LDL Cholesterol: 167 mg/dL — ABNORMAL HIGH (ref 0–99)
NonHDL: 186.96
Total CHOL/HDL Ratio: 5
Triglycerides: 102 mg/dL (ref 0.0–149.0)
VLDL: 20.4 mg/dL (ref 0.0–40.0)

## 2016-09-18 LAB — COMPREHENSIVE METABOLIC PANEL
ALT: 39 U/L (ref 0–53)
AST: 20 U/L (ref 0–37)
Albumin: 4.5 g/dL (ref 3.5–5.2)
Alkaline Phosphatase: 68 U/L (ref 39–117)
BILIRUBIN TOTAL: 0.8 mg/dL (ref 0.2–1.2)
BUN: 12 mg/dL (ref 6–23)
CALCIUM: 9.3 mg/dL (ref 8.4–10.5)
CO2: 30 mEq/L (ref 19–32)
Chloride: 105 mEq/L (ref 96–112)
Creatinine, Ser: 1.14 mg/dL (ref 0.40–1.50)
GFR: 88.49 mL/min (ref >60.00)
Glucose, Bld: 85 mg/dL (ref 70–99)
Potassium: 4.2 mEq/L (ref 3.5–5.1)
Sodium: 141 mEq/L (ref 135–145)
Total Protein: 7.2 g/dL (ref 6.0–8.3)

## 2016-09-18 LAB — PSA: PSA: 1.51 ng/mL (ref 0.10–4.00)

## 2016-09-18 LAB — TSH: TSH: 1.5 u[IU]/mL (ref 0.35–4.50)

## 2016-09-18 LAB — VITAMIN D 25 HYDROXY (VIT D DEFICIENCY, FRACTURES): VITD: 10.63 ng/mL — ABNORMAL LOW (ref 30.00–100.00)

## 2016-09-18 NOTE — Patient Instructions (Addendum)
I want you to resume your blood pressure medication,  Regular exercise,  And lose 10 lbs!   I Recommended Low Carb high Protein premixed Shakes or the protein bars for breakfast:   Premier Protein  Atkins Advantage Muscle Milk EAS AdvantEdge   All of these are available at [employer], [place]  And taste good    Try the KIND low glycemic index  Bars or the Atkins bars   This is  Dr. Lupita Dawn  example of a  "Low GI"  Diet:  It will allow you to lose 4 to 8  lbs  per month if you follow it carefully.  Your goal with exercise is a minimum of 30 minutes of aerobic exercise 5 days per week (Walking does not count once it becomes easy!)    All of the foods can be found at grocery stores and in bulk at [employer].  The Atkins protein bars and shakes are available in more varieties at [employer], [employer] and [place].     7 AM Breakfast:  Choose from the following:  Low carbohydrate Protein  Shakes (I recommend the  Premier Protein chocolate shakes,  EAS AdvantEdge "Carb Control" shakes  Or the Atkins shakes all are under 3 net carbs)     a scrambled egg/bacon/cheese burrito made with [employer]'s "carb balance" whole wheat tortilla  (about 10 net carbs )  Regulatory affairs officer (basically a quiche without the pastry crust) that is eaten cold and very convenient way to get your eggs.  8 carbs)  If you make your own protein shakes, avoid bananas and pineapple,  And use low carb greek yogurt or original /unsweetened almond or soy milk    Avoid cereal and bananas, oatmeal and cream of wheat and grits. They are loaded with carbohydrates!   10 AM: high protein snack:  Protein bar by Atkins (the snack size, under 200 cal, usually < 6 net carbs).    A stick of cheese:  Around 1 carb,  100 cal     Dannon Light n Fit [place] Yogurt  (80 cal, 8 carbs)  Other so called "protein bars" and Greek yogurts tend to be loaded with carbohydrates.  Remember, in food  advertising, the word "energy" is synonymous for " carbohydrate."  Lunch:   A Sandwich using the bread choices listed, Can use any  Eggs,  lunchmeat, grilled meat or canned tuna), avocado, regular mayo/mustard  and cheese.  A Salad using blue cheese, ranch,  Goddess or vinagrette,  Avoid taco shells, croutons or "confetti" and no "candied nuts" but regular nuts OK.   No pretzels, nabs  or chips.  Pickles and miniature sweet peppers are a good low carb alternative that provide a "crunch"  The bread is the only source of carbohydrate in a sandwich and  can be decreased by trying some of the attached alternatives to traditional loaf bread   Avoid "Low fat dressings, as well as Cherry dressings They are loaded with sugar!   3 PM/ Mid day  Snack:  Consider  1 ounce of  almonds, walnuts, pistachios, pecans, peanuts,  Macadamia nuts or a nut medley.  Avoid "granola and granola bars "  Mixed nuts are ok in moderation as long as there are no raisins,  cranberries or dried fruit.   KIND bars are OK if you get the low glycemic index variety   Try the prosciutto/mozzarella cheese sticks by  [employer]  In Forensic psychologist /backery section   High protein      6 PM  Dinner:     Meat/fowl/fish with a green salad, and either broccoli, cauliflower, green beans, spinach, brussel sprouts or  Lima beans. DO NOT BREAD THE PROTEIN!!      There is a low carb pasta by Dreamfield's that is acceptable and tastes great: only 5 digestible carbs/serving.( All grocery stores but BJs carry it ) Several ready made meals are available low carb:   Try Michel Angelo's chicken piccata or chicken or eggplant parm over low carb pasta.(Lowes and BJs)   Marjory Lies Sanchez's "Carnitas" (pulled pork, no sauce,  0 carbs) or his beef pot roast to make a dinner burrito (at [employer])  Pesto over low carb pasta (bj's sells a good quality pesto in the center refrigerated section of the deli   Try satueeing  Cheral Marker with mushroooms as a  good side   [employer] makes a mashed cauliflower that tastes like mashed potatoes  Whole wheat pasta is still full of digestible carbs and  Not as low in glycemic index as Dreamfield's.   Brown rice is still rice,  So skip the rice and noodles if you eat [place] or [place] (or at least limit to 1/2 cup)  9 PM snack :   Breyer's "low carb" fudgsicle or  ice cream bar (Carb Smart line), or  Weight Watcher's ice cream bar , or another "no sugar added" ice cream;  a serving of fresh berries/cherries with whipped cream   Cheese or DANNON'S LlGHT N FIT GREEK YOGURT  8 ounces of [place] unsweetened almond/cococunut milk    Treat yourself to a parfait made with whipped cream blueberiies, walnuts and vanilla greek yogurt  Avoid bananas, pineapple, grapes  and watermelon on a regular basis because they are high in sugar.  THINK OF THEM AS DESSERT  Remember that snack Substitutions should be less than 10 NET carbs per serving and meals < 20 carbs. Remember to subtract fiber grams to get the "net carbs."

## 2016-09-18 NOTE — Progress Notes (Signed)
Pre-visit discussion using our clinic review tool. No additional management support is needed unless otherwise documented below in the visit note.  

## 2016-09-18 NOTE — Progress Notes (Signed)
Patient ID: Dylan Haynes, male    DOB: 1971-08-05  Age: 45 y.o. MRN: [id number]  The patient is here for annual  wellness examination but has been lost to follow up since April 2016 and has not been taking his medication for hypertension and hyperlipidemia.  He is accompanied by his wife Dylan Haynes .they are expecting their 5th child together, all boys   Since his last visit , his mother died of complications of a CVA  At age 64 . Died  on Aug 29, 2023 After 15 days in the hospital last month,  Had tobacco abuse and hypertension, atherosclerosis      The risk factors are reflected in the social history.  The roster of all physicians providing medical care to patient - is listed in the Snapshot section of the chart.  Home safety : The patient has smoke detectors in the home. They wear seatbelts.  There are no firearms at home. There is no violence in the home.   There is no risks for hepatitis, STDs or HIV. There is no   history of blood transfusion. They have no travel history to infectious disease endemic areas of the world.  The patient has seen their dentist in the last six month. They have  not  seen their eye doctor in the last year.   They do not  have excessive sun exposure. Discussed the need for sun protection: hats, long sleeves and use of sunscreen if there is significant sun exposure.   Diet: the importance of a healthy diet is discussed. They do not have a healthy diet.  The benefits of regular aerobic exercise were discussed. He is not exercising.   Depression screen: there are no signs or vegative symptoms of depression- irritability, change in appetite, anhedonia, sadness/tearfullness.  The following portions of the patient's history were reviewed and updated as appropriate: allergies, current medications, past family history, past medical history,  past surgical history, past social history  and problem list.  Visual acuity was not assessed per patient preference since she has  regular follow up with her ophthalmologist. Hearing and body mass index were assessed and reviewed.   During the course of the visit the patient was educated and counseled about appropriate screening and preventive services including : fall prevention , diabetes screening, nutrition counseling, colorectal cancer screening, and recommended immunizations.    CC: The primary encounter diagnosis was Essential hypertension. Diagnoses of Hyperlipidemia LDL goal <100, Prostate cancer screening, Vitamin D deficiency, Weight gain, Fatigue, unspecified type, Pure hypercholesterolemia, and Encounter for preventive health examination were also pertinent to this visit.  History [name] has a past medical history of Hypertension.   He has no past surgical history on file.   His family history includes Cancer (age of onset: 31) in his maternal grandmother; Heart disease in his paternal uncle; Hypertension in his mother; Stroke in his mother.He reports that he has quit smoking. His smoking use included Cigars. He has never used smokeless tobacco. He reports that he does not drink alcohol or use drugs.  Outpatient Medications Prior to Visit  Medication Sig Dispense Refill  . amLODipine (NORVASC) 10 MG tablet Take 1 tablet (10 mg total) by mouth daily. 30 tablet 0  . amLODipine (NORVASC) 10 MG tablet TAKE ONE TABLET BY MOUTH ONCE DAILY 30 tablet 0  . atorvastatin (LIPITOR) 20 MG tablet Take 1 tablet (20 mg total) by mouth daily. 90 tablet 3   No facility-administered medications prior to visit.  Review of Systems  Objective:  BP (!) 160/98   Pulse 68   Temp 98 F (36.7 C) (Oral)   Resp 16   Ht 6' 3.75" (1.924 m)   Wt 251 lb 8 oz (114.1 kg)   SpO2 97%   BMI 30.82 kg/m   Physical Exam    Assessment & Plan:   Problem List Items Addressed This Visit    Encounter for preventive health examination    Annual comprehensive preventive exam was done as well as an evaluation and management of acute  and chronic conditions .  During the course of the visit the patient was educated and counseled about appropriate screening and preventive services including :  diabetes screening, lipid analysis with projected  10 year  risk for CAD , nutrition counseling, prostate and colorectal cancer screening, and recommended immunizations.  Printed recommendations for health maintenance screenings was given.       Hyperlipidemia    Using the Framingham risk calculator,  his 10 year risk of coronary artery disease is 15%. Statin therapy will be recommended.       Hypertension - Primary    Uncontrolled due to medication lapse,  Resume amlodipine and return in one week for bp check .  Will add an ARB if additional therapy is needed , based on minimal proteinuria.       Relevant Orders   Comprehensive metabolic panel (Completed)   Lipid panel (Completed)   Microalbumin / creatinine urine ratio (Completed)   Weight gain    I have addressed  BMI and recommended wt loss of 10% of body weigh over the next 6 months using a low glycemic index diet and regular exercise a minimum of 5 days per week.         Other Visit Diagnoses    Hyperlipidemia LDL goal <100       Prostate cancer screening       Relevant Orders   PSA (Completed)   Vitamin D deficiency       Relevant Orders   VITAMIN D 25 Hydroxy (Vit-D Deficiency, Fractures) (Completed)   Fatigue, unspecified type       Relevant Orders   HIV antibody (Completed)   TSH (Completed)   CBC with Differential/Platelet (Completed)   Urinalysis, Routine w reflex microscopic (Completed)      I have discontinued Mr. Karczewski's atorvastatin. I am also having him maintain his amLODipine.  No orders of the defined types were placed in this encounter.   Medications Discontinued During This Encounter  Medication Reason  . amLODipine (NORVASC) 10 MG tablet Duplicate  . atorvastatin (LIPITOR) 20 MG tablet One time medication    Follow-up: Return in about  1 week (around 09/25/2016), or BP CHECK .   Crecencio Mc, MD

## 2016-09-19 LAB — HIV ANTIBODY (ROUTINE TESTING W REFLEX): HIV: NONREACTIVE

## 2016-09-21 ENCOUNTER — Other Ambulatory Visit: Payer: Self-pay | Admitting: Internal Medicine

## 2016-09-21 DIAGNOSIS — E559 Vitamin D deficiency, unspecified: Secondary | ICD-10-CM | POA: Insufficient documentation

## 2016-09-21 MED ORDER — ERGOCALCIFEROL 1.25 MG (50000 UT) PO CAPS: 50000.0000 [IU] | ORAL_CAPSULE | ORAL | 3 refills | Status: DC

## 2016-09-21 NOTE — Assessment & Plan Note (Signed)
I have addressed  BMI and recommended wt loss of 10% of body weigh over the next 6 months using a low glycemic index diet and regular exercise a minimum of 5 days per week.   

## 2016-09-21 NOTE — Addendum Note (Signed)
Addended by: Crecencio Mc on: 09/21/2016 01:47 PM   Modules accepted: Orders

## 2016-09-21 NOTE — Assessment & Plan Note (Signed)
Uncontrolled due to medication lapse,  Resume amlodipine and return in one week for bp check .  Will add an ARB if additional therapy is needed , based on minimal proteinuria.

## 2016-09-21 NOTE — Assessment & Plan Note (Signed)

## 2016-09-21 NOTE — Assessment & Plan Note (Signed)
Using the Framingham risk calculator,  his 10 year risk of coronary artery disease is 15%. Statin therapy will be recommended.

## 2016-09-25 ENCOUNTER — Ambulatory Visit (INDEPENDENT_AMBULATORY_CARE_PROVIDER_SITE_OTHER): Payer: 59

## 2016-09-25 ENCOUNTER — Other Ambulatory Visit: Payer: Self-pay | Admitting: Internal Medicine

## 2016-09-25 VITALS — BP 148/94 | HR 74

## 2016-09-25 DIAGNOSIS — I10 Essential (primary) hypertension: Secondary | ICD-10-CM

## 2016-09-25 MED ORDER — HYDROCHLOROTHIAZIDE 25 MG PO TABS: 25.0000 mg | ORAL_TABLET | Freq: Every day | ORAL | 3 refills | Status: DC

## 2016-09-25 NOTE — Progress Notes (Signed)
Adding hctz w5 mg daily,  Take daily in the am as soon as he wakes up

## 2016-09-25 NOTE — Progress Notes (Signed)
Patient advised and verbalized an understanding  

## 2016-09-25 NOTE — Progress Notes (Signed)
Patient comes in for 1 week blood pressure check after re- starting amlodipine. Blood pressure was checked several time due to being elevated while patient was in office.  Patient was advised Dr Derrel Nip will add additional blood pressure medication .   Please advise.

## 2016-10-01 ENCOUNTER — Telehealth: Payer: Self-pay | Admitting: Internal Medicine

## 2016-10-01 NOTE — Telephone Encounter (Signed)
Pt spouse Beckie Busing called and wanted to know when pt needed to come back for an appt. Please advise, thank you!  Call @ [phone]

## 2016-10-02 NOTE — Telephone Encounter (Signed)
Tell him to read his my chart,  Or read it to him

## 2016-10-02 NOTE — Telephone Encounter (Signed)
Spoke with patient and they were unsure when to f/u on last bp check will try to schedule appt with Dr. Derrel Nip in a month and pt requesting lab results   Please advise

## 2016-10-03 NOTE — Telephone Encounter (Signed)
Patient was informed of results.  Patient understood and no questions, comments, or concerns at this time.  

## 2016-10-22 ENCOUNTER — Other Ambulatory Visit: Payer: Self-pay | Admitting: Internal Medicine

## 2016-10-22 NOTE — Telephone Encounter (Signed)
PT Norvasc shows discontinued on 09/18/16 but pt is requesting refills? BP was last checked in house on 09/25/16 148/94.  Pt last labs and OV were on 09/18/16. OK to refill?

## 2016-10-22 NOTE — Telephone Encounter (Signed)
Pt spouse called and needs a refill on this medication. He is completely out.   [facility] Egan), [place] [facility] [place]  Call pt @ [phone]

## 2016-10-22 NOTE — Telephone Encounter (Signed)
Pt has requested a update on medication. Pt contact [phone]

## 2016-10-23 ENCOUNTER — Other Ambulatory Visit: Payer: Self-pay | Admitting: Radiology

## 2016-10-23 DIAGNOSIS — I10 Essential (primary) hypertension: Secondary | ICD-10-CM

## 2016-10-23 MED ORDER — AMLODIPINE BESYLATE 10 MG PO TABS: 10.0000 mg | ORAL_TABLET | Freq: Every day | ORAL | 0 refills | Status: DC

## 2016-10-23 NOTE — Telephone Encounter (Signed)
Refilled 90 days , MyChart message sent

## 2016-11-06 ENCOUNTER — Encounter: Payer: Self-pay | Admitting: Internal Medicine

## 2016-11-06 ENCOUNTER — Ambulatory Visit (INDEPENDENT_AMBULATORY_CARE_PROVIDER_SITE_OTHER): Payer: 59 | Admitting: Internal Medicine

## 2016-11-06 VITALS — BP 140/96 | HR 81 | Resp 16 | Wt 243.0 lb

## 2016-11-06 DIAGNOSIS — E7800 Pure hypercholesterolemia, unspecified: Secondary | ICD-10-CM | POA: Diagnosis not present

## 2016-11-06 DIAGNOSIS — I10 Essential (primary) hypertension: Secondary | ICD-10-CM | POA: Diagnosis not present

## 2016-11-06 DIAGNOSIS — E663 Overweight: Secondary | ICD-10-CM | POA: Diagnosis not present

## 2016-11-06 DIAGNOSIS — E785 Hyperlipidemia, unspecified: Secondary | ICD-10-CM

## 2016-11-06 MED ORDER — LOSARTAN POTASSIUM-HCTZ 50-12.5 MG PO TABS: 1.0000 | ORAL_TABLET | Freq: Every day | ORAL | 3 refills | Status: DC

## 2016-11-06 NOTE — Progress Notes (Signed)
Subjective:  Patient ID: Dylan Haynes, male    DOB: [birth year]  Age: 46 y.o. MRN: [id number]  CC: The primary encounter diagnosis was Hyperlipidemia LDL goal <100. Diagnoses of Overweight (BMI 25.0-29.9), Essential hypertension, and Pure hypercholesterolemia were also pertinent to this visit.  HPI Dylan Haynes presents for follow up on obesity , new onset, and hypertension  Has lost 8 lbs by reducing portion size,  Doing more walking  Daily.    Patient is taking his medications as prescribed and notes no adverse effects.  Home BP readings have been done about once per week and are  generally < 130/80 .  he is avoiding added salt in her diet and walking regularly about 4 times per week for exercise  .  Outpatient Medications Prior to Visit  Medication Sig Dispense Refill  . amLODipine (NORVASC) 10 MG tablet TAKE ONE TABLET BY MOUTH ONCE DAILY 90 tablet 1  . ergocalciferol (DRISDOL) 50000 units capsule Take 1 capsule (50,000 Units total) by mouth once a week. 4 capsule 3  . amLODipine (NORVASC) 10 MG tablet Take 1 tablet (10 mg total) by mouth daily. 30 tablet 0  . hydrochlorothiazide (HYDRODIURIL) 25 MG tablet Take 1 tablet (25 mg total) by mouth daily. 90 tablet 3   No facility-administered medications prior to visit.     Review of Systems;  Patient denies headache, fevers, malaise, unintentional weight loss, skin rash, eye pain, sinus congestion and sinus pain, sore throat, dysphagia,  hemoptysis , cough, dyspnea, wheezing, chest pain, palpitations, orthopnea, edema, abdominal pain, nausea, melena, diarrhea, constipation, flank pain, dysuria, hematuria, urinary  Frequency, nocturia, numbness, tingling, seizures,  Focal weakness, Loss of consciousness,  Tremor, insomnia, depression, anxiety, and suicidal ideation.      Objective:  BP (!) 140/96   Pulse 81   Resp 16   Wt 243 lb (110.2 kg)   SpO2 93%   BMI 29.77 kg/m   BP Readings from Last 3 Encounters:  11/06/16 (!)  140/96  09/25/16 (!) 148/94  09/18/16 (!) 160/98    Wt Readings from Last 3 Encounters:  11/06/16 243 lb (110.2 kg)  09/18/16 251 lb 8 oz (114.1 kg)  01/22/15 243 lb 12 oz (110.6 kg)    General appearance: alert, cooperative and appears stated age Ears: normal TM's and external ear canals both ears Throat: lips, mucosa, and tongue normal; teeth and gums normal Neck: no adenopathy, no carotid bruit, supple, symmetrical, trachea midline and thyroid not enlarged, symmetric, no tenderness/mass/nodules Back: symmetric, no curvature. ROM normal. No CVA tenderness. Lungs: clear to auscultation bilaterally Heart: regular rate and rhythm, S1, S2 normal, no murmur, click, rub or gallop Abdomen: soft, non-tender; bowel sounds normal; no masses,  no organomegaly Pulses: 2+ and symmetric Skin: Skin color, texture, turgor normal. No rashes or lesions Lymph nodes: Cervical, supraclavicular, and axillary nodes normal.  No results found for: HGBA1C  Lab Results  Component Value Date   CREATININE 1.14 09/18/2016   CREATININE 1.16 01/22/2015   CREATININE 1.2 12/29/2012    Lab Results  Component Value Date   WBC 7.5 09/18/2016   HGB 15.8 09/18/2016   HCT 46.4 09/18/2016   PLT 185.0 09/18/2016   GLUCOSE 85 09/18/2016   CHOL 231 (H) 09/18/2016   TRIG 102.0 09/18/2016   HDL 44.40 09/18/2016   LDLDIRECT 188.4 12/29/2012   LDLCALC 167 (H) 09/18/2016   ALT 39 09/18/2016   AST 20 09/18/2016   NA 141 09/18/2016   K 4.2 09/18/2016  CL 105 09/18/2016   CREATININE 1.14 09/18/2016   BUN 12 09/18/2016   CO2 30 09/18/2016   TSH 1.50 09/18/2016   PSA 1.51 09/18/2016   MICROALBUR 2.7 (H) 09/18/2016    No results found.  Assessment & Plan:   Problem List Items Addressed This Visit    Hyperlipidemia    Using the Framingham risk calculator,  his 10 year risk of coronary artery disease is 15%. Since he is losing weight,  Will repeat in 3 months before recommending Statin therapy .  Baby  aspirin advised.       Relevant Medications   losartan-hydrochlorothiazide (HYZAAR) 50-12.5 MG tablet   Hypertension    Improved but not at goal,  Adding hctz to amlodipine       Relevant Medications   losartan-hydrochlorothiazide (HYZAAR) 50-12.5 MG tablet   Overweight (BMI 25.0-29.9)    I have congratulated him in reduction of   BMI and encouraged  Continued weight loss with goal of 10% of body weigh over the next 6 months using a low glycemic index diet and regular exercise a minimum of 5 days per week.         Other Visit Diagnoses    Hyperlipidemia LDL goal <100    -  Primary   Relevant Medications   losartan-hydrochlorothiazide (HYZAAR) 50-12.5 MG tablet   Other Relevant Orders   Lipid panel   Comprehensive metabolic panel      I have discontinued Mr. Begnaud's hydrochlorothiazide. I am also having him start on losartan-hydrochlorothiazide. Additionally, I am having him maintain his ergocalciferol and amLODipine.  Meds ordered this encounter  Medications  . losartan-hydrochlorothiazide (HYZAAR) 50-12.5 MG tablet    Sig: Take 1 tablet by mouth daily.    Dispense:  90 tablet    Refill:  3    Medications Discontinued During This Encounter  Medication Reason  . amLODipine (NORVASC) 10 MG tablet Duplicate  . hydrochlorothiazide (HYDRODIURIL) 25 MG tablet     Follow-up: Return in about 3 months (around 02/03/2017).   Dylan Mc, MD

## 2016-11-06 NOTE — Progress Notes (Signed)
Pre visit review using our clinic review tool, if applicable. No additional management support is needed unless otherwise documented below in the visit note. 

## 2016-11-06 NOTE — Patient Instructions (Addendum)
  You have lost 8 lbs!  And your cholestero lis down 30 pts  Blood pressure is improved but not goal yet, so:  Continue amlodipine 10 mg daily for bp   I am Changing hctz to losartan/hctz for better control of blood pressure     Goal is 120/70   call me if your blood pressure is not at goal after one week of the new regimen   Remember to suspend the hctz containing medication if you get an illness that makes you dehydrated (diarrhea, vomiting)   I recommend starting a baby aspirin daily and repeating your cholesterol in 3 months prior to next visit

## 2016-11-08 NOTE — Assessment & Plan Note (Signed)
Using the Framingham risk calculator,  his 10 year risk of coronary artery disease is 15%. Since he is losing weight,  Will repeat in 3 months before recommending Statin therapy .  Baby aspirin advised.

## 2016-11-08 NOTE — Assessment & Plan Note (Signed)
I have congratulated him in reduction of   BMI and encouraged  Continued weight loss with goal of 10% of body weigh over the next 6 months using a low glycemic index diet and regular exercise a minimum of 5 days per week.   

## 2016-11-08 NOTE — Assessment & Plan Note (Signed)
Improved but not at goal,  Adding hctz to amlodipine

## 2017-02-03 ENCOUNTER — Other Ambulatory Visit (INDEPENDENT_AMBULATORY_CARE_PROVIDER_SITE_OTHER): Payer: 59

## 2017-02-03 DIAGNOSIS — E785 Hyperlipidemia, unspecified: Secondary | ICD-10-CM

## 2017-02-03 LAB — LIPID PANEL
CHOLESTEROL: 225 mg/dL — AB (ref 0–200)
HDL: 46 mg/dL (ref >39.00)
LDL CALC: 165 mg/dL — AB (ref 0–99)
NONHDL: 178.94
Total CHOL/HDL Ratio: 5
Triglycerides: 68 mg/dL (ref 0.0–149.0)
VLDL: 13.6 mg/dL (ref 0.0–40.0)

## 2017-02-03 LAB — COMPREHENSIVE METABOLIC PANEL
ALBUMIN: 4.4 g/dL (ref 3.5–5.2)
ALT: 31 U/L (ref 0–53)
AST: 21 U/L (ref 0–37)
Alkaline Phosphatase: 52 U/L (ref 39–117)
BUN: 14 mg/dL (ref 6–23)
CHLORIDE: 104 meq/L (ref 96–112)
CO2: 30 mEq/L (ref 19–32)
Calcium: 9.5 mg/dL (ref 8.4–10.5)
Creatinine, Ser: 1.26 mg/dL (ref 0.40–1.50)
GFR: 78.71 mL/min (ref >60.00)
Glucose, Bld: 90 mg/dL (ref 70–99)
POTASSIUM: 3.9 meq/L (ref 3.5–5.1)
SODIUM: 140 meq/L (ref 135–145)
Total Bilirubin: 1 mg/dL (ref 0.2–1.2)
Total Protein: 7.3 g/dL (ref 6.0–8.3)

## 2017-02-05 ENCOUNTER — Encounter: Payer: Self-pay | Admitting: Internal Medicine

## 2017-02-05 ENCOUNTER — Ambulatory Visit (INDEPENDENT_AMBULATORY_CARE_PROVIDER_SITE_OTHER): Payer: 59 | Admitting: Internal Medicine

## 2017-02-05 VITALS — BP 136/98 | HR 77 | Temp 98.3°F | Resp 16 | Ht 75.75 in | Wt 248.4 lb

## 2017-02-05 DIAGNOSIS — Z79899 Other long term (current) drug therapy: Secondary | ICD-10-CM

## 2017-02-05 DIAGNOSIS — I10 Essential (primary) hypertension: Secondary | ICD-10-CM | POA: Diagnosis not present

## 2017-02-05 DIAGNOSIS — E7800 Pure hypercholesterolemia, unspecified: Secondary | ICD-10-CM | POA: Diagnosis not present

## 2017-02-05 MED ORDER — RED YEAST RICE 600 MG PO CAPS: 1.0000 | ORAL_CAPSULE | Freq: Two times a day (BID) | ORAL | 11 refills | Status: DC

## 2017-02-05 MED ORDER — AMLODIPINE BESYLATE 5 MG PO TABS: 5.0000 mg | ORAL_TABLET | Freq: Every day | ORAL | 1 refills | Status: DC

## 2017-02-05 NOTE — Progress Notes (Signed)
Subjective:  Patient ID: Dylan Haynes, male    DOB: 1971/07/14  Age: 46 y.o. MRN: [id number]  CC: The primary encounter diagnosis was Long-term use of high-risk medication. Diagnoses of Pure hypercholesterolemia and Essential hypertension were also pertinent to this visit.  HPI Dylan Haynes presents for follow up on hypertension and hyperlipidemia.  Patient is taking his new medications as prescribed and notes no adverse effects.  He was incorrectly stopped his amlodipine.  Home BP readings have not  been done  He is avoiding added salt in his  diet but has not been and walking regularly or doing anything on a regular basis for exercise  .  Outpatient Medications Prior to Visit  Medication Sig Dispense Refill  . losartan-hydrochlorothiazide (HYZAAR) 50-12.5 MG tablet Take 1 tablet by mouth daily. 90 tablet 3  . ergocalciferol (DRISDOL) 50000 units capsule Take 1 capsule (50,000 Units total) by mouth once a week. (Patient not taking: Reported on 02/05/2017) 4 capsule 3  . amLODipine (NORVASC) 10 MG tablet TAKE ONE TABLET BY MOUTH ONCE DAILY (Patient not taking: Reported on 02/05/2017) 90 tablet 1   No facility-administered medications prior to visit.     Review of Systems;  Patient denies headache, fevers, malaise, unintentional weight loss, skin rash, eye pain, sinus congestion and sinus pain, sore throat, dysphagia,  hemoptysis , cough, dyspnea, wheezing, chest pain, palpitations, orthopnea, edema, abdominal pain, nausea, melena, diarrhea, constipation, flank pain, dysuria, hematuria, urinary  Frequency, nocturia, numbness, tingling, seizures,  Focal weakness, Loss of consciousness,  Tremor, insomnia, depression, anxiety, and suicidal ideation.      Objective:  BP (!) 136/98 (BP Location: Left Arm, Patient Position: Sitting, Cuff Size: Large)   Pulse 77   Temp 98.3 F (36.8 C) (Oral)   Resp 16   Ht 6' 3.75" (1.924 m)   Wt 248 lb 6.4 oz (112.7 kg)   SpO2 96%   BMI 30.44 kg/m    BP Readings from Last 3 Encounters:  02/05/17 (!) 136/98  11/06/16 (!) 140/96  09/25/16 (!) 148/94    Wt Readings from Last 3 Encounters:  02/05/17 248 lb 6.4 oz (112.7 kg)  11/06/16 243 lb (110.2 kg)  09/18/16 251 lb 8 oz (114.1 kg)    General appearance: alert, cooperative and appears stated age Ears: normal TM's and external ear canals both ears Throat: lips, mucosa, and tongue normal; teeth and gums normal Neck: no adenopathy, no carotid bruit, supple, symmetrical, trachea midline and thyroid not enlarged, symmetric, no tenderness/mass/nodules Back: symmetric, no curvature. ROM normal. No CVA tenderness. Lungs: clear to auscultation bilaterally Heart: regular rate and rhythm, S1, S2 normal, no murmur, click, rub or gallop Abdomen: soft, non-tender; bowel sounds normal; no masses,  no organomegaly Pulses: 2+ and symmetric Skin: Skin color, texture, turgor normal. No rashes or lesions Lymph nodes: Cervical, supraclavicular, and axillary nodes normal.  No results found for: HGBA1C  Lab Results  Component Value Date   CREATININE 1.26 02/03/2017   CREATININE 1.14 09/18/2016   CREATININE 1.16 01/22/2015    Lab Results  Component Value Date   WBC 7.5 09/18/2016   HGB 15.8 09/18/2016   HCT 46.4 09/18/2016   PLT 185.0 09/18/2016   GLUCOSE 90 02/03/2017   CHOL 225 (H) 02/03/2017   TRIG 68.0 02/03/2017   HDL 46.00 02/03/2017   LDLDIRECT 188.4 12/29/2012   LDLCALC 165 (H) 02/03/2017   ALT 31 02/03/2017   AST 21 02/03/2017   NA 140 02/03/2017   K 3.9  02/03/2017   CL 104 02/03/2017   CREATININE 1.26 02/03/2017   BUN 14 02/03/2017   CO2 30 02/03/2017   TSH 1.50 09/18/2016   PSA 1.51 09/18/2016   MICROALBUR 2.7 (H) 09/18/2016    No results found.  Assessment & Plan:   Problem List Items Addressed This Visit    Hypertension    Not at goal due to patient stopping amlodipine when he started losartan.  medications  Outlined in detail: continue losartan,  hctz and  amlodipine . Return for RN visit in 3 weeks to check BP.  Renal function and lytes unchanged.   Lab Results  Component Value Date   CREATININE 1.26 02/03/2017   Lab Results  Component Value Date   NA 140 02/03/2017   K 3.9 02/03/2017   CL 104 02/03/2017   CO2 30 02/03/2017         Relevant Medications   amLODipine (NORVASC) 5 MG tablet   Hyperlipidemia    Using the Framingham risk calculator,  his 10 year risk of coronary artery disease remains 15%. Recommending trial fo red yeast rice bid,.  Lab Results  Component Value Date   CHOL 225 (H) 02/03/2017   HDL 46.00 02/03/2017   LDLCALC 165 (H) 02/03/2017   LDLDIRECT 188.4 12/29/2012   TRIG 68.0 02/03/2017   CHOLHDL 5 02/03/2017         Relevant Medications   amLODipine (NORVASC) 5 MG tablet    Other Visit Diagnoses    Long-term use of high-risk medication    -  Primary   Relevant Orders   Hepatic function panel     A total of 25 minutes of face to face time was spent with patient more than half of which was spent in counselling and coordination of care   I have changed Dylan Haynes's amLODipine. I am also having him start on Red Yeast Rice. Additionally, I am having him maintain his ergocalciferol and losartan-hydrochlorothiazide.  Meds ordered this encounter  Medications  . amLODipine (NORVASC) 5 MG tablet    Sig: Take 1 tablet (5 mg total) by mouth daily.    Dispense:  90 tablet    Refill:  1    Please consider 90 day supplies to promote better adherence  . Red Yeast Rice 600 MG CAPS    Sig: Take 1 capsule (600 mg total) by mouth 2 (two) times daily.    Dispense:  60 capsule    Refill:  11    Medications Discontinued During This Encounter  Medication Reason  . amLODipine (NORVASC) 10 MG tablet Reorder    Follow-up: Return in about 2 weeks (around 02/19/2017), or RN for BP check/ hepatic panel ;  bring home machine .   Crecencio Mc, MD

## 2017-02-05 NOTE — Patient Instructions (Addendum)
Your blood pressure is not at goal yet.   You should be taking  Both of these medications:   Losartan hctz  50/12.5  : once daily  AND  AMLODIPINE 5 mg once daily   Return after at least a week on this regimen for BP check  [employer] makes a good home BP machine .  Consider buying one and bring with you to your appointment   Your cholesterol is still elevated and your risk of heart attack over the next 10 years is 15% (one in 7)  Please consider a trial of red Yeast Rice as a natural remedy, along with a low cholesterol  diet,  For your borderline elevated cholesterol.  The natural remedies for cholesterol have not been proven to reduce your risk for a heart attack.  Red Yeast Rice has not been proven either,  But it  does lower cholesterol, so if you want to try it , the dose is 600 mg twice daily in capsule form, available OTC.   Fat and Cholesterol Restricted Diet High levels of fat and cholesterol in your blood may lead to various health problems, such as diseases of the heart, blood vessels, gallbladder, liver, and pancreas. Fats are concentrated sources of energy that come in various forms. Certain types of fat, including saturated fat, may be harmful in excess. Cholesterol is a substance needed by your body in small amounts. Your body makes all the cholesterol it needs. Excess cholesterol comes from the food you eat. When you have high levels of cholesterol and saturated fat in your blood, health problems can develop because the excess fat and cholesterol will gather along the walls of your blood vessels, causing them to narrow. Choosing the right foods will help you control your intake of fat and cholesterol. This will help keep the levels of these substances in your blood within normal limits and reduce your risk of disease. What is my plan? Your health care provider recommends that you:  Limit your fat intake to ______% or less of your total calories per day.  Limit the amount of  cholesterol in your diet to less than _________mg per day.  Eat 20-30 grams of fiber each day. What types of fat should I choose?  Choose healthy fats more often. Choose monounsaturated and polyunsaturated fats, such as olive and canola oil, flaxseeds, walnuts, almonds, and seeds.  Eat more omega-3 fats. Good choices include salmon, mackerel, sardines, tuna, flaxseed oil, and ground flaxseeds. Aim to eat fish at least two times a week.  Limit saturated fats. Saturated fats are primarily found in animal products, such as meats, butter, and cream. Plant sources of saturated fats include palm oil, palm kernel oil, and coconut oil.  Avoid foods with partially hydrogenated oils in them. These contain trans fats. Examples of foods that contain trans fats are stick margarine, some tub margarines, cookies, crackers, and other baked goods. What general guidelines do I need to follow? These guidelines for healthy eating will help you control your intake of fat and cholesterol:  Check food labels carefully to identify foods with trans fats or high amounts of saturated fat.  Fill one half of your plate with vegetables and green salads.  Fill one fourth of your plate with whole grains. Look for the word "whole" as the first word in the ingredient list.  Fill one fourth of your plate with lean protein foods.  Limit fruit to two servings a day. Choose fruit instead of juice.  Eat  more foods that contain fiber, such as apples, broccoli, carrots, beans, peas, and barley.  Eat more home-cooked food and less restaurant, buffet, and fast food.  Limit or avoid alcohol.  Limit foods high in starch and sugar.  Limit fried foods.  Cook foods using methods other than frying. Baking, boiling, grilling, and broiling are all great options.  Lose weight if you are overweight. Losing just 5-10% of your initial body weight can help your overall health and prevent diseases such as diabetes and heart  disease. What foods can I eat? Grains   Whole grains, such as whole wheat or whole grain breads, crackers, cereals, and pasta. Unsweetened oatmeal, bulgur, barley, quinoa, or brown rice. Corn or whole wheat flour tortillas. Vegetables   Fresh or frozen vegetables (raw, steamed, roasted, or grilled). Green salads. Fruits   All fresh, canned (in natural juice), or frozen fruits. Meats and other protein foods   Ground beef (85% or leaner), grass-fed beef, or beef trimmed of fat. Skinless chicken or [place]. Ground chicken or [place]. Pork trimmed of fat. All fish and seafood. Eggs. Dried beans, peas, or lentils. Unsalted nuts or seeds. Unsalted canned or dry beans. Dairy   Low-fat dairy products, such as skim or 1% milk, 2% or reduced-fat cheeses, low-fat ricotta or cottage cheese, or plain low-fat yo Fats and oils   Tub margarines without trans fats. Light or reduced-fat mayonnaise and salad dressings. Avocado. Olive, canola, sesame, or safflower oils. Natural peanut or almond butter (choose ones without added sugar and oil). The items listed above may not be a complete list of recommended foods or beverages. Contact your dietitian for more options.  Foods to avoid Grains   White bread. White pasta. White rice. Cornbread. Bagels, pastries, and croissants. Crackers that contain trans fat. Vegetables   White potatoes. Corn. Creamed or fried vegetables. Vegetables in a cheese sauce. Fruits   Dried fruits. Canned fruit in light or heavy syrup. Fruit juice. Meats and other protein foods   Fatty cuts of meat. Ribs, chicken wings, bacon, sausage, bologna, salami, chitterlings, fatback, hot dogs, bratwurst, and packaged luncheon meats. Liver and organ meats. Dairy   Whole or 2% milk, cream, half-and-half, and cream cheese. Whole milk cheeses. Whole-fat or sweetened yogurt. Full-fat cheeses. Nondairy creamers and whipped toppings. Processed cheese, cheese spreads, or cheese curds. Beverages    Alcohol. Sweetened drinks (such as sodas, lemonade, and fruit drinks or punches). Fats and oils   Butter, stick margarine, lard, shortening, ghee, or bacon fat. Coconut, palm kernel, or palm oils. Sweets and desserts   Corn syrup, sugars, honey, and molasses. Candy. Jam and jelly. Syrup. Sweetened cereals. Cookies, pies, cakes, donuts, muffins, and ice cream. The items listed above may not be a complete list of foods and beverages to avoid. Contact your dietitian for more information.  This information is not intended to replace advice given to you by your health care provider. Make sure you discuss any questions you have with your health care provider. Document Released: 09/15/2005 Document Revised: 10/06/2014 Document Reviewed: 12/14/2013 Elsevier Interactive Patient Education  2017 [employer].

## 2017-02-07 NOTE — Assessment & Plan Note (Addendum)
Using the Fr[place]ham risk calculator,  his 10 year risk of coronary artery disease remains 15%. Recommending trial fo red yeast rice bid,.  Lab Results  Component Value Date   CHOL 225 (H) 02/03/2017   HDL 46.00 02/03/2017   LDLCALC 165 (H) 02/03/2017   LDLDIRECT 188.4 12/29/2012   TRIG 68.0 02/03/2017   CHOLHDL 5 02/03/2017

## 2017-02-07 NOTE — Assessment & Plan Note (Signed)
Not at goal due to patient stopping amlodipine when he started losartan.  medications  Outlined in detail: continue losartan,  hctz and amlodipine . Return for RN visit in 3 weeks to check BP.  Renal function and lytes unchanged.   Lab Results  Component Value Date   CREATININE 1.26 02/03/2017   Lab Results  Component Value Date   NA 140 02/03/2017   K 3.9 02/03/2017   CL 104 02/03/2017   CO2 30 02/03/2017

## 2017-02-19 ENCOUNTER — Ambulatory Visit (INDEPENDENT_AMBULATORY_CARE_PROVIDER_SITE_OTHER): Payer: 59

## 2017-02-19 ENCOUNTER — Encounter: Payer: Self-pay | Admitting: Internal Medicine

## 2017-02-19 ENCOUNTER — Other Ambulatory Visit (INDEPENDENT_AMBULATORY_CARE_PROVIDER_SITE_OTHER): Payer: 59

## 2017-02-19 VITALS — BP 138/80 | HR 83 | Resp 18

## 2017-02-19 DIAGNOSIS — Z79899 Other long term (current) drug therapy: Secondary | ICD-10-CM

## 2017-02-19 DIAGNOSIS — I10 Essential (primary) hypertension: Secondary | ICD-10-CM | POA: Diagnosis not present

## 2017-02-19 LAB — HEPATIC FUNCTION PANEL
ALT: 35 U/L (ref 0–53)
AST: 26 U/L (ref 0–37)
Albumin: 4.7 g/dL (ref 3.5–5.2)
Alkaline Phosphatase: 54 U/L (ref 39–117)
BILIRUBIN TOTAL: 1.1 mg/dL (ref 0.2–1.2)
Bilirubin, Direct: 0.2 mg/dL (ref 0.0–0.3)
Total Protein: 7.5 g/dL (ref 6.0–8.3)

## 2017-02-19 MED ORDER — AMLODIPINE BESYLATE 10 MG PO TABS: 10.0000 mg | ORAL_TABLET | Freq: Every day | ORAL | 1 refills | Status: DC

## 2017-02-19 NOTE — Progress Notes (Signed)
Not at goal yet   Increase amlodipine to 10 mg  ,  Continue losartan hct as well

## 2017-02-19 NOTE — Progress Notes (Addendum)
Per chart patient was supposed to bring home machine to compare. When asked, no machine at appt and he advised I haven't checked my BP this week at all.  Checked bilateral upper extremities with manual cuff, see vitals.  Patient states that he is compliant with medications, taking amlopodine and HCTZ/Losartan. Please advise, thanks  He had labs drawn today also. Thanks   BP improved but not at goaL.  Please increase your amlodipine dose to 10 mg daily and continue takinh losartan /hctz one tablet daily in am     Deborra Medina, MD

## 2017-02-19 NOTE — Assessment & Plan Note (Signed)
Increase amldoipine to 10 mg daily for RN visit 138/80 . Continue losartan/hct

## 2017-02-19 NOTE — Addendum Note (Signed)
Addended by: Crecencio Mc on: 02/19/2017 03:20 PM   Modules accepted: Orders

## 2017-02-20 ENCOUNTER — Telehealth: Payer: Self-pay | Admitting: *Deleted

## 2017-02-20 NOTE — Telephone Encounter (Signed)
Message was sent yesterday vi mychart. Can you not see it?

## 2017-02-20 NOTE — Progress Notes (Signed)
Left a VM to return my call to discuss medication changes needed based on nurse visit. thanks

## 2017-02-20 NOTE — Telephone Encounter (Signed)
Patient requested lab results Pt contact [phone]

## 2017-02-20 NOTE — Telephone Encounter (Signed)
Please advise 

## 2017-02-25 NOTE — Progress Notes (Signed)
Spoke with the patient, he started increasing the dose on Sunday when he got the medication from the pharmacy. thanks

## 2017-03-05 NOTE — Telephone Encounter (Signed)
Mailed unread message to patient.  

## 2017-08-29 ENCOUNTER — Other Ambulatory Visit: Payer: Self-pay | Admitting: Internal Medicine

## 2017-10-28 ENCOUNTER — Telehealth: Payer: Self-pay

## 2017-10-28 MED ORDER — TELMISARTAN-HCTZ 40-12.5 MG PO TABS: 1.0000 | ORAL_TABLET | Freq: Every day | ORAL | 5 refills | Status: DC

## 2017-10-28 NOTE — Telephone Encounter (Signed)
telmisartan hct sent to [facility]

## 2017-10-28 NOTE — Telephone Encounter (Signed)
Received a fax from pt's pharmacy stating the the losartan/hctz and plain losartan are on backorder. Would you like to change the medication or see if the pt is willing to use a different pharmacy?

## 2017-12-02 ENCOUNTER — Other Ambulatory Visit: Payer: Self-pay | Admitting: Internal Medicine

## 2018-03-08 ENCOUNTER — Other Ambulatory Visit: Payer: Self-pay | Admitting: Internal Medicine

## 2018-09-24 ENCOUNTER — Other Ambulatory Visit: Payer: Self-pay | Admitting: Internal Medicine

## 2018-12-08 ENCOUNTER — Encounter: Payer: Self-pay | Admitting: Internal Medicine

## 2018-12-08 ENCOUNTER — Ambulatory Visit (INDEPENDENT_AMBULATORY_CARE_PROVIDER_SITE_OTHER): Payer: 59 | Admitting: Internal Medicine

## 2018-12-08 ENCOUNTER — Other Ambulatory Visit: Payer: Self-pay

## 2018-12-08 VITALS — BP 138/88 | HR 84 | Temp 98.1°F | Resp 15 | Ht 75.75 in | Wt 249.8 lb

## 2018-12-08 DIAGNOSIS — E785 Hyperlipidemia, unspecified: Secondary | ICD-10-CM

## 2018-12-08 DIAGNOSIS — E669 Obesity, unspecified: Secondary | ICD-10-CM

## 2018-12-08 DIAGNOSIS — R5383 Other fatigue: Secondary | ICD-10-CM | POA: Diagnosis not present

## 2018-12-08 DIAGNOSIS — I10 Essential (primary) hypertension: Secondary | ICD-10-CM

## 2018-12-08 DIAGNOSIS — R7301 Impaired fasting glucose: Secondary | ICD-10-CM

## 2018-12-08 DIAGNOSIS — Z0000 Encounter for general adult medical examination without abnormal findings: Secondary | ICD-10-CM

## 2018-12-08 DIAGNOSIS — E782 Mixed hyperlipidemia: Secondary | ICD-10-CM

## 2018-12-08 DIAGNOSIS — Z1159 Encounter for screening for other viral diseases: Secondary | ICD-10-CM

## 2018-12-08 NOTE — Patient Instructions (Signed)
Good to see you!   The new goals for optimal blood pressure management are 120/70.  Please check your blood pressure a few times at home and send me the readings so I can determine if you need a change in medication .  Return next week for fasting labs ( 4 hours is sufficient for the fast )   Next year you will be due for some form of colon ca screening;  This year we are doing Prostate cancer screening with a blood test   Health Maintenance, Male A healthy lifestyle and preventive care is important for your health and wellness. Ask your health care provider about what schedule of regular examinations is right for you. What should I know about weight and diet? Eat a Healthy Diet  Eat plenty of vegetables, fruits, whole grains, low-fat dairy products, and lean protein.  Do not eat a lot of foods high in solid fats, added sugars, or salt.  Maintain a Healthy Weight Regular exercise can help you achieve or maintain a healthy weight. You should:  Do at least 150 minutes of exercise each week. The exercise should increase your heart rate and make you sweat (moderate-intensity exercise).  Do strength-training exercises at least twice a week. Watch Your Levels of Cholesterol and Blood Lipids  Have your blood tested for lipids and cholesterol every 5 years starting at 50 years of age. If you are at high risk for heart disease, you should start having your blood tested when you are 50 years old. You may need to have your cholesterol levels checked more often if: ? Your lipid or cholesterol levels are high. ? You are older than 50 years of age. ? You are at high risk for heart disease. What should I know about cancer screening? Many types of cancers can be detected early and may often be prevented. Lung Cancer  You should be screened every year for lung cancer if: ? You are a current smoker who has smoked for at least 30 years. ? You are a former smoker who has quit within the past 15  years.  Talk to your health care provider about your screening options, when you should start screening, and how often you should be screened. Colorectal Cancer  Routine colorectal cancer screening usually begins at 50 years of age and should be repeated every 5-10 years until you are 50 years old. You may need to be screened more often if early forms of precancerous polyps or small growths are found. Your health care provider may recommend screening at an earlier age if you have risk factors for colon cancer.  Your health care provider may recommend using home test kits to check for hidden blood in the stool.  A small camera at the end of a tube can be used to examine your colon (sigmoidoscopy or colonoscopy). This checks for the earliest forms of colorectal cancer. Prostate and Testicular Cancer  Depending on your age and overall health, your health care provider may do certain tests to screen for prostate and testicular cancer.  Talk to your health care provider about any symptoms or concerns you have about testicular or prostate cancer. Skin Cancer  Check your skin from head to toe regularly.  Tell your health care provider about any new moles or changes in moles, especially if: ? There is a change in a mole's size, shape, or color. ? You have a mole that is larger than a pencil eraser.  Always use sunscreen. Apply sunscreen  liberally and repeat throughout the day.  Protect yourself by wearing long sleeves, pants, a wide-brimmed hat, and sunglasses when outside. What should I know about heart disease, diabetes, and high blood pressure?  If you are 73-61 years of age, have your blood pressure checked every 3-5 years. If you are 85 years of age or older, have your blood pressure checked every year. You should have your blood pressure measured twice-once when you are at a hospital or clinic, and once when you are not at a hospital or clinic. Record the average of the two measurements. To  check your blood pressure when you are not at a hospital or clinic, you can use: ? An automated blood pressure machine at a pharmacy. ? A home blood pressure monitor.  Talk to your health care provider about your target blood pressure.  If you are between 71-26 years old, ask your health care provider if you should take aspirin to prevent heart disease.  Have regular diabetes screenings by checking your fasting blood sugar level. ? If you are at a normal weight and have a low risk for diabetes, have this test once every three years after the age of 13. ? If you are overweight and have a high risk for diabetes, consider being tested at a younger age or more often.  A one-time screening for abdominal aortic aneurysm (AAA) by ultrasound is recommended for men aged 37-75 years who are current or former smokers. What should I know about preventing infection? Hepatitis B If you have a higher risk for hepatitis B, you should be screened for this virus. Talk with your health care provider to find out if you are at risk for hepatitis B infection. Hepatitis C Blood testing is recommended for:  Everyone born from [birth year] through 1967.  Anyone with known risk factors for hepatitis C. Sexually Transmitted Diseases (STDs)  You should be screened each year for STDs including gonorrhea and chlamydia if: ? You are sexually active and are younger than 50 years of age. ? You are older than 50 years of age and your health care provider tells you that you are at risk for this type of infection. ? Your sexual activity has changed since you were last screened and you are at an increased risk for chlamydia or gonorrhea. Ask your health care provider if you are at risk.  Talk with your health care provider about whether you are at high risk of being infected with HIV. Your health care provider may recommend a prescription medicine to help prevent HIV infection. What else can I do?  Schedule regular health,  dental, and eye exams.  Stay current with your vaccines (immunizations).  Do not use any tobacco products, such as cigarettes, chewing tobacco, and e-cigarettes. If you need help quitting, ask your health care provider.  Limit alcohol intake to no more than 2 drinks per day. One drink equals 12 ounces of beer, 5 ounces of wine, or 1 ounces of hard liquor.  Do not use street drugs.  Do not share needles.  Ask your health care provider for help if you need support or information about quitting drugs.  Tell your health care provider if you often feel depressed.  Tell your health care provider if you have ever been abused or do not feel safe at home. This information is not intended to replace advice given to you by your health care provider. Make sure you discuss any questions you have with your health care provider.  Document Released: 03/13/2008 Document Revised: 05/14/2016 Document Reviewed: 06/19/2015 Elsevier Interactive Patient Education  2019 [employer].

## 2018-12-08 NOTE — Progress Notes (Signed)
Patient ID: CHRISTAIN NIZNIK, male    DOB: Jun 17, 1971  Age: 48 y.o. MRN: [id number]  The patient is here for annual preventive  examination and management of other hypertension, hyperlipidemia and overweight .   The risk factors are reflected in the social history.  The roster of all physicians providing medical care to patient - is listed in the Snapshot section of the chart.  Activities of daily living:  The patient is 100% independent in all ADLs: dressing, toileting, feeding as well as independent mobility  Home safety : The patient has smoke detectors in the home. They wear seatbelts.  There are no firearms at home. There is no violence in the home.   There is no risks for hepatitis, STDs or HIV. There is no   history of blood transfusion. They have no travel history to infectious disease endemic areas of the world.  The patient has seen their dentist in the last six month. They have seen their eye doctor in the last year. They deny any  hearing difficulty with regard to whispered voices and some television programs.  They have deferred audiologic testing in the last year.  They do not  have excessive sun exposure. Discussed the need for sun protection: hats, long sleeves and use of sunscreen if there is significant sun exposure.   Diet: the importance of a healthy diet is discussed. They do not have a healthy diet due to frequent intake of fast food.   The benefits of regular aerobic exercise were discussed. She walks 4 times per week ,  20 minutes. He is not exercising regularly   No signs of depression- irritability, change in appetite, anhedonia, sadness/tearfullness.  Cognitive assessment: the patient manages all their financial and personal affairs and is actively engaged. They could relate day,date,year and events; recalled 2/3 objects at 3 minutes; performed clock-face test normally.  The following portions of the patient's history were reviewed and updated as appropriate:  allergies, current medications, past family history, past medical history,  past surgical history, past social history  and problem list.  Visual acuity was not assessed per patient preference since she has regular follow up with her ophthalmologist. Hearing and body mass index were assessed and reviewed.   CC: The primary encounter diagnosis was Other fatigue. Diagnoses of Hyperlipidemia LDL goal <130, Encounter for hepatitis C screening test for low risk patient, Impaired fasting glucose, Elevated blood pressure reading with diagnosis of hypertension, Essential hypertension, Mixed hyperlipidemia, Obesity (BMI 30.0-34.9), and Encounter for preventive health examination were also pertinent to this visit.  History Ladon has a past medical history of Hypertension.   He has no past surgical history on file.   His family history includes Cancer (age of onset: 37) in his maternal grandmother; Heart disease in his paternal uncle; Hypertension in his mother; Stroke in his mother.He reports that he has quit smoking. His smoking use included cigars. He has never used smokeless tobacco. He reports that he does not drink alcohol or use drugs.  Outpatient Medications Prior to Visit  Medication Sig Dispense Refill  . amLODipine (NORVASC) 10 MG tablet TAKE 1 TABLET BY MOUTH ONCE DAILY 90 tablet 0  . ergocalciferol (DRISDOL) 50000 units capsule Take 1 capsule (50,000 Units total) by mouth once a week. (Patient not taking: Reported on 02/05/2017) 4 capsule 3  . losartan-hydrochlorothiazide (HYZAAR) 50-12.5 MG tablet TAKE 1 TABLET BY MOUTH ONCE DAILY (Patient not taking: Reported on 12/08/2018) 90 tablet 0  . Red Yeast Rice 600  MG CAPS Take 1 capsule (600 mg total) by mouth 2 (two) times daily. (Patient not taking: Reported on 12/08/2018) 60 capsule 11  . telmisartan-hydrochlorothiazide (MICARDIS HCT) 40-12.5 MG tablet Take 1 tablet by mouth daily. (Patient not taking: Reported on 12/08/2018) 30 tablet 5   No  facility-administered medications prior to visit.     Review of Systems   Patient denies headache, fevers, malaise, unintentional weight loss, skin rash, eye pain, sinus congestion and sinus pain, sore throat, dysphagia,  hemoptysis , cough, dyspnea, wheezing, chest pain, palpitations, orthopnea, edema, abdominal pain, nausea, melena, diarrhea, constipation, flank pain, dysuria, hematuria, urinary  Frequency, nocturia, numbness, tingling, seizures,  Focal weakness, Loss of consciousness,  Tremor, insomnia, depression, anxiety, and suicidal ideation.       Objective:  BP 138/88 (BP Location: Left Arm, Patient Position: Sitting, Cuff Size: Large)   Pulse 84   Temp 98.1 F (36.7 C) (Oral)   Resp 15   Ht 6' 3.75" (1.924 m)   Wt 249 lb 12.8 oz (113.3 kg)   SpO2 96%   BMI 30.61 kg/m   Physical Exam   General appearance: alert, cooperative and appears stated age Ears: normal TM's and external ear canals both ears Throat: lips, mucosa, and tongue normal; teeth and gums normal Neck: no adenopathy, no carotid bruit, supple, symmetrical, trachea midline and thyroid not enlarged, symmetric, no tenderness/mass/nodules Back: symmetric, no curvature. ROM normal. No CVA tenderness. Lungs: clear to auscultation bilaterally Heart: regular rate and rhythm, S1, S2 normal, no murmur, click, rub or gallop Abdomen: soft, non-tender; bowel sounds normal; no masses,  no organomegaly Pulses: 2+ and symmetric Skin: Skin color, texture, turgor normal. No rashes or lesions Lymph nodes: Cervical, supraclavicular, and axillary nodes normal.    Assessment & Plan:   Problem List Items Addressed This Visit    Obesity (BMI 30.0-34.9)    With hypertension and hyperlipidemia.  I have addressed  BMI and recommended a low glycemic index diet regular participation in aerobic exercise with a goal of 30 minutes of aerobic exercise a minimum of 5 days per week. Screening for lipid disorders, thyroid and diabetes to be  done today.        Hypertension    Losartan dc'd in Sept 2019 due to being lost to follow up since 2017. Continue  amlodipine 10 mg daily  resume losartan/hct or telmisartan following home assessment of bp and follow up assessment of lytes/renal function      Hyperlipidemia    No longer taking RYR for previous 10 yr risk of CAD using the Framingham risk calculator,  Of 15%. Repeat lipids needed  Lab Results  Component Value Date   CHOL 225 (H) 02/03/2017   HDL 46.00 02/03/2017   LDLCALC 165 (H) 02/03/2017   LDLDIRECT 188.4 12/29/2012   TRIG 68.0 02/03/2017   CHOLHDL 5 02/03/2017         Encounter for preventive health examination    age appropriate education and counseling updated, referrals for preventative services and immunizations addressed, dietary and smoking counseling addressed, most recent labs reviewed.  I have personally reviewed and have noted:  1) the patient's medical and social history 2) The pt's use of alcohol, tobacco, and illicit drugs 3) The patient's current medications and supplements 4) Functional ability including ADL's, fall risk, home safety risk, hearing and visual impairment 5) Diet and physical activities 6) Evidence for depression or mood disorder 7) The patient's height, weight, and BMI have been recorded in the chart  I have made referrals, and provided counseling and education based on review of the above       Other Visit Diagnoses    Other fatigue    -  Primary   Relevant Orders   Comprehensive metabolic panel   CBC with Differential/Platelet   TSH   Hyperlipidemia LDL goal <130       Relevant Orders   Lipid panel   Encounter for hepatitis C screening test for low risk patient       Relevant Orders   Hepatitis C Antibody   Impaired fasting glucose       Relevant Orders   Hemoglobin A1c   Elevated blood pressure reading with diagnosis of hypertension       Relevant Orders   Microalbumin / creatinine urine ratio      I  have discontinued Oz B. Line's ergocalciferol, Red Yeast Rice, telmisartan-hydrochlorothiazide, and losartan-hydrochlorothiazide. I am also having him maintain his amLODipine.  No orders of the defined types were placed in this encounter.   Medications Discontinued During This Encounter  Medication Reason  . ergocalciferol (DRISDOL) 50000 units capsule Completed Course  . losartan-hydrochlorothiazide (HYZAAR) 50-12.5 MG tablet Patient has not taken in last 30 days  . Red Yeast Rice 600 MG CAPS Patient Preference  . telmisartan-hydrochlorothiazide (MICARDIS HCT) 40-12.5 MG tablet Patient has not taken in last 30 days    Follow-up: Return in about 6 months (around 06/10/2019).   Crecencio Mc, MD

## 2018-12-11 DIAGNOSIS — E669 Obesity, unspecified: Secondary | ICD-10-CM | POA: Insufficient documentation

## 2018-12-11 NOTE — Assessment & Plan Note (Signed)
No longer taking RYR for previous 10 yr risk of CAD using the Framingham risk calculator,  Of 15%. Repeat lipids needed  Lab Results  Component Value Date   CHOL 225 (H) 02/03/2017   HDL 46.00 02/03/2017   LDLCALC 165 (H) 02/03/2017   LDLDIRECT 188.4 12/29/2012   TRIG 68.0 02/03/2017   CHOLHDL 5 02/03/2017

## 2018-12-11 NOTE — Assessment & Plan Note (Signed)

## 2018-12-11 NOTE — Assessment & Plan Note (Addendum)
Losartan dc'd in Sept 2019 due to being lost to follow up since 2017. Continue  amlodipine 10 mg daily  resume losartan/hct or telmisartan following home assessment of bp and follow up assessment of lytes/renal function

## 2018-12-11 NOTE — Assessment & Plan Note (Signed)
With hypertension and hyperlipidemia.  I have addressed  BMI and recommended a low glycemic index diet regular participation in aerobic exercise with a goal of 30 minutes of aerobic exercise a minimum of 5 days per week. Screening for lipid disorders, thyroid and diabetes to be done today.

## 2018-12-15 ENCOUNTER — Other Ambulatory Visit (INDEPENDENT_AMBULATORY_CARE_PROVIDER_SITE_OTHER): Payer: 59

## 2018-12-15 ENCOUNTER — Other Ambulatory Visit: Payer: Self-pay

## 2018-12-15 DIAGNOSIS — Z1159 Encounter for screening for other viral diseases: Secondary | ICD-10-CM

## 2018-12-15 DIAGNOSIS — R7301 Impaired fasting glucose: Secondary | ICD-10-CM

## 2018-12-15 DIAGNOSIS — R5383 Other fatigue: Secondary | ICD-10-CM

## 2018-12-15 DIAGNOSIS — I10 Essential (primary) hypertension: Secondary | ICD-10-CM | POA: Diagnosis not present

## 2018-12-15 DIAGNOSIS — E785 Hyperlipidemia, unspecified: Secondary | ICD-10-CM

## 2018-12-15 LAB — CBC WITH DIFFERENTIAL/PLATELET
Basophils Absolute: 0 10*3/uL (ref 0.0–0.1)
Basophils Relative: 0.4 % (ref 0.0–3.0)
Eosinophils Absolute: 0.1 10*3/uL (ref 0.0–0.7)
Eosinophils Relative: 1.3 % (ref 0.0–5.0)
HCT: 47.1 % (ref 39.0–52.0)
Hemoglobin: 16.2 g/dL (ref 13.0–17.0)
Lymphocytes Relative: 37 % (ref 12.0–46.0)
Lymphs Abs: 2.7 10*3/uL (ref 0.7–4.0)
MCHC: 34.4 g/dL (ref 30.0–36.0)
MCV: 88.7 fl (ref 78.0–100.0)
MONO ABS: 0.7 10*3/uL (ref 0.1–1.0)
Monocytes Relative: 10.2 % (ref 3.0–12.0)
Neutro Abs: 3.7 10*3/uL (ref 1.4–7.7)
Neutrophils Relative %: 51.1 % (ref 43.0–77.0)
Platelets: 204 10*3/uL (ref 150.0–400.0)
RBC: 5.31 Mil/uL (ref 4.22–5.81)
RDW: 13.7 % (ref 11.5–15.5)
WBC: 7.2 10*3/uL (ref 4.0–10.5)

## 2018-12-15 LAB — MICROALBUMIN / CREATININE URINE RATIO
Creatinine,U: 126.3 mg/dL
Microalb Creat Ratio: 0.6 mg/g (ref 0.0–30.0)
Microalb, Ur: <0.7 mg/dL (ref 0.0–1.9)

## 2018-12-15 LAB — COMPREHENSIVE METABOLIC PANEL
ALK PHOS: 72 U/L (ref 39–117)
ALT: 29 U/L (ref 0–53)
AST: 18 U/L (ref 0–37)
Albumin: 4.6 g/dL (ref 3.5–5.2)
BUN: 10 mg/dL (ref 6–23)
CO2: 30 mEq/L (ref 19–32)
Calcium: 9.8 mg/dL (ref 8.4–10.5)
Chloride: 102 mEq/L (ref 96–112)
Creatinine, Ser: 1.19 mg/dL (ref 0.40–1.50)
GFR: 78.49 mL/min (ref >60.00)
Glucose, Bld: 87 mg/dL (ref 70–99)
Potassium: 4 mEq/L (ref 3.5–5.1)
Sodium: 139 mEq/L (ref 135–145)
Total Bilirubin: 0.9 mg/dL (ref 0.2–1.2)
Total Protein: 7.4 g/dL (ref 6.0–8.3)

## 2018-12-15 LAB — LIPID PANEL
Cholesterol: 255 mg/dL — ABNORMAL HIGH (ref 0–200)
HDL: 44.8 mg/dL (ref >39.00)
LDL Cholesterol: 189 mg/dL — ABNORMAL HIGH (ref 0–99)
NonHDL: 209.92
Total CHOL/HDL Ratio: 6
Triglycerides: 104 mg/dL (ref 0.0–149.0)
VLDL: 20.8 mg/dL (ref 0.0–40.0)

## 2018-12-15 LAB — HEMOGLOBIN A1C: HEMOGLOBIN A1C: 5.6 % (ref 4.6–6.5)

## 2018-12-15 LAB — TSH: TSH: 1.86 u[IU]/mL (ref 0.35–4.50)

## 2018-12-16 LAB — HEPATITIS C ANTIBODY
HEP C AB: NONREACTIVE
SIGNAL TO CUT-OFF: 0.01 (ref <1.00)

## 2018-12-28 ENCOUNTER — Emergency Department (HOSPITAL_COMMUNITY): Payer: 59

## 2018-12-28 ENCOUNTER — Inpatient Hospital Stay (HOSPITAL_COMMUNITY)
Admission: EM | Admit: 2018-12-28 | Discharge: 2018-12-30 | DRG: 247 | Disposition: A | Discharge status: Home / Self Care | Payer: 59 | Attending: Internal Medicine | Admitting: Internal Medicine

## 2018-12-28 ENCOUNTER — Other Ambulatory Visit: Payer: Self-pay

## 2018-12-28 DIAGNOSIS — Z7982 Long term (current) use of aspirin: Secondary | ICD-10-CM

## 2018-12-28 DIAGNOSIS — I249 Acute ischemic heart disease, unspecified: Secondary | ICD-10-CM

## 2018-12-28 DIAGNOSIS — E876 Hypokalemia: Secondary | ICD-10-CM | POA: Diagnosis present

## 2018-12-28 DIAGNOSIS — R079 Chest pain, unspecified: Secondary | ICD-10-CM | POA: Diagnosis not present

## 2018-12-28 DIAGNOSIS — I214 Non-ST elevation (NSTEMI) myocardial infarction: Secondary | ICD-10-CM | POA: Diagnosis not present

## 2018-12-28 DIAGNOSIS — Z79899 Other long term (current) drug therapy: Secondary | ICD-10-CM

## 2018-12-28 DIAGNOSIS — Z955 Presence of coronary angioplasty implant and graft: Secondary | ICD-10-CM

## 2018-12-28 DIAGNOSIS — Z0389 Encounter for observation for other suspected diseases and conditions ruled out: Secondary | ICD-10-CM | POA: Diagnosis not present

## 2018-12-28 DIAGNOSIS — N182 Chronic kidney disease, stage 2 (mild): Secondary | ICD-10-CM | POA: Diagnosis present

## 2018-12-28 DIAGNOSIS — Z87891 Personal history of nicotine dependence: Secondary | ICD-10-CM

## 2018-12-28 DIAGNOSIS — Z6829 Body mass index (BMI) 29.0-29.9, adult: Secondary | ICD-10-CM

## 2018-12-28 DIAGNOSIS — N179 Acute kidney failure, unspecified: Secondary | ICD-10-CM

## 2018-12-28 DIAGNOSIS — I129 Hypertensive chronic kidney disease with stage 1 through stage 4 chronic kidney disease, or unspecified chronic kidney disease: Secondary | ICD-10-CM | POA: Diagnosis present

## 2018-12-28 DIAGNOSIS — I10 Essential (primary) hypertension: Secondary | ICD-10-CM | POA: Diagnosis present

## 2018-12-28 DIAGNOSIS — Z807 Family history of other malignant neoplasms of lymphoid, hematopoietic and related tissues: Secondary | ICD-10-CM

## 2018-12-28 DIAGNOSIS — E669 Obesity, unspecified: Secondary | ICD-10-CM | POA: Diagnosis present

## 2018-12-28 DIAGNOSIS — I2582 Chronic total occlusion of coronary artery: Secondary | ICD-10-CM | POA: Diagnosis present

## 2018-12-28 DIAGNOSIS — Z8249 Family history of ischemic heart disease and other diseases of the circulatory system: Secondary | ICD-10-CM

## 2018-12-28 DIAGNOSIS — I2510 Atherosclerotic heart disease of native coronary artery without angina pectoris: Secondary | ICD-10-CM | POA: Diagnosis present

## 2018-12-28 DIAGNOSIS — E785 Hyperlipidemia, unspecified: Secondary | ICD-10-CM | POA: Diagnosis present

## 2018-12-28 DIAGNOSIS — R0902 Hypoxemia: Secondary | ICD-10-CM | POA: Diagnosis not present

## 2018-12-28 DIAGNOSIS — I213 ST elevation (STEMI) myocardial infarction of unspecified site: Secondary | ICD-10-CM | POA: Diagnosis not present

## 2018-12-28 DIAGNOSIS — I252 Old myocardial infarction: Secondary | ICD-10-CM | POA: Diagnosis present

## 2018-12-28 LAB — CBC WITH DIFFERENTIAL/PLATELET
Abs Immature Granulocytes: 0.04 10*3/uL (ref 0.00–0.07)
Basophils Absolute: 0 10*3/uL (ref 0.0–0.1)
Basophils Relative: 0 %
EOS ABS: 0 10*3/uL (ref 0.0–0.5)
EOS PCT: 0 %
HCT: 46.1 % (ref 39.0–52.0)
Hemoglobin: 15.6 g/dL (ref 13.0–17.0)
Immature Granulocytes: 0 %
Lymphocytes Relative: 45 %
Lymphs Abs: 5.2 10*3/uL — ABNORMAL HIGH (ref 0.7–4.0)
MCH: 29.6 pg (ref 26.0–34.0)
MCHC: 33.8 g/dL (ref 30.0–36.0)
MCV: 87.5 fL (ref 80.0–100.0)
MONO ABS: 0.9 10*3/uL (ref 0.1–1.0)
Monocytes Relative: 8 %
Neutro Abs: 5.3 10*3/uL (ref 1.7–7.7)
Neutrophils Relative %: 47 %
Platelets: 191 10*3/uL (ref 150–400)
RBC: 5.27 MIL/uL (ref 4.22–5.81)
RDW: 12.5 % (ref 11.5–15.5)
WBC: 11.5 10*3/uL — ABNORMAL HIGH (ref 4.0–10.5)
nRBC: 0 % (ref 0.0–0.2)

## 2018-12-28 LAB — COMPREHENSIVE METABOLIC PANEL
ALT: 32 U/L (ref 0–44)
AST: 27 U/L (ref 15–41)
Albumin: 4.6 g/dL (ref 3.5–5.0)
Alkaline Phosphatase: 69 U/L (ref 38–126)
Anion gap: 10 (ref 5–15)
BUN: 11 mg/dL (ref 6–20)
CO2: 26 mmol/L (ref 22–32)
Calcium: 9.4 mg/dL (ref 8.9–10.3)
Chloride: 103 mmol/L (ref 98–111)
Creatinine, Ser: 1.39 mg/dL — ABNORMAL HIGH (ref 0.61–1.24)
GFR calc Af Amer: >60 mL/min (ref >60)
GFR calc non Af Amer: 59 mL/min — ABNORMAL LOW (ref >60)
Glucose, Bld: 163 mg/dL — ABNORMAL HIGH (ref 70–99)
Potassium: 3.3 mmol/L — ABNORMAL LOW (ref 3.5–5.1)
Sodium: 139 mmol/L (ref 135–145)
Total Bilirubin: 1.1 mg/dL (ref 0.3–1.2)
Total Protein: 7.8 g/dL (ref 6.5–8.1)

## 2018-12-28 LAB — TROPONIN I: Troponin I: <0.03 ng/mL (ref <0.03)

## 2018-12-28 MED ORDER — SODIUM CHLORIDE 0.9 % IV BOLUS
500.0000 mL | Freq: Once | INTRAVENOUS | Status: AC
  Administered 2018-12-28: 500 mL via INTRAVENOUS

## 2018-12-28 MED ORDER — IOPAMIDOL (ISOVUE-370) INJECTION 76%
100.0000 mL | Freq: Once | INTRAVENOUS | Status: AC | PRN
  Administered 2018-12-28: 100 mL via INTRAVENOUS

## 2018-12-28 MED ORDER — MORPHINE SULFATE (PF) 4 MG/ML IV SOLN
4.0000 mg | Freq: Once | INTRAVENOUS | Status: AC
  Administered 2018-12-28: 4 mg via INTRAVENOUS
  Filled 2018-12-28: qty 1

## 2018-12-28 MED ORDER — LIDOCAINE HCL (PF) 1 % IJ SOLN
INTRAMUSCULAR | Status: AC
  Filled 2018-12-28: qty 30

## 2018-12-28 MED ORDER — KETOROLAC TROMETHAMINE 30 MG/ML IJ SOLN
30.0000 mg | Freq: Once | INTRAMUSCULAR | Status: AC
  Administered 2018-12-28: 30 mg via INTRAVENOUS
  Filled 2018-12-28: qty 1

## 2018-12-28 MED ORDER — IOPAMIDOL (ISOVUE-370) INJECTION 76%
INTRAVENOUS | Status: AC
  Filled 2018-12-28: qty 100

## 2018-12-28 NOTE — ED Notes (Signed)
Pt spouse, Beckie Busing, came to ED to check on pt.  Family updated about no visitors at this time.  Please call number in contact information to update or ask any questions.

## 2018-12-28 NOTE — ED Provider Notes (Signed)
[facility] EMERGENCY DEPARTMENT Provider Note   CSN: [id number] Arrival date & time: 12/28/18  2150    History   Chief Complaint Chief Complaint  Patient presents with  . Code STEMI    HPI Dylan Haynes is a 48 y.o. male.     HPI Patient presents with acute onset central chest pain.  States that started while bending over in the shower.  Describes the pain as tightness.  Complaining of some right arm tingling.  Received aspirin and nitroglycerin x2 en route.  Per EMS patient was diaphoretic and repeat EKG demonstrated ST segment elevation in the inferior leads.  Code STEMI was called.  Patient's been given 100 mcg of fentanyl in route as well.  No new lower extremity swelling or pain.  Patient has minimal shortness of breath.  Denies cough, fever or chills.  No family history of cardiac disease.  Patient does have a history of hypertension for which he takes amlodipine. Past Medical History:  Diagnosis Date  . Hypertension     Patient Active Problem List   Diagnosis Date Noted  . AKI (acute kidney injury) ([place])   . Chest pain 12/29/2018  . Elevated troponin 12/29/2018  . Hypokalemia 12/29/2018  . Acute renal failure superimposed on stage 2 chronic kidney disease ([place]) 12/29/2018  . NSTEMI (non-ST elevated myocardial infarction) ([place]) 12/29/2018  . Acute coronary syndrome ([place])   . Obesity (BMI 30.0-34.9) 12/11/2018  . Vitamin D deficiency 09/21/2016  . Peyronie disease 01/22/2015  . Encounter for preventive health examination 12/30/2012  . Hyperlipidemia 12/30/2012  . Family history of multiple myeloma 10/14/2011  . Overweight (BMI 25.0-29.9) 10/14/2011  . Hypertension     Past Surgical History:  Procedure Laterality Date  . CORONARY STENT INTERVENTION N/A 12/29/2018   Procedure: CORONARY STENT INTERVENTION;  Surgeon: Troy Sine, MD;  Location: [facility];  Service: Cardiovascular;  Laterality: N/A;  . cyst removal in back    . LEFT  HEART CATH AND CORONARY ANGIOGRAPHY N/A 12/29/2018   Procedure: LEFT HEART CATH AND CORONARY ANGIOGRAPHY;  Surgeon: Troy Sine, MD;  Location: [facility];  Service: Cardiovascular;  Laterality: N/A;        Home Medications    Prior to Admission medications   Medication Sig Start Date End Date Taking? Authorizing Provider  aspirin 81 MG chewable tablet Chew 1 tablet (81 mg total) by mouth daily. 12/31/18   Florencia Reasons, MD  atorvastatin (LIPITOR) 80 MG tablet Take 1 tablet (80 mg total) by mouth daily at 6 PM. 12/30/18   Florencia Reasons, MD  carvedilol (COREG) 6.25 MG tablet Take 1 tablet (6.25 mg total) by mouth 2 (two) times daily with a meal. 12/30/18   Florencia Reasons, MD  nitroGLYCERIN (NITROSTAT) 0.4 MG SL tablet Place 1 tablet (0.4 mg total) under the tongue every 5 (five) minutes as needed for chest pain. 12/30/18   Florencia Reasons, MD  ticagrelor (BRILINTA) 90 MG TABS tablet Take 1 tablet (90 mg total) by mouth 2 (two) times daily. Please contact cardiology Dr Criotoru's office for prior authorization or refills. 12/30/18   Florencia Reasons, MD    Family History Family History  Problem Relation Age of Onset  . Cancer Maternal Grandmother 36       multiple myeloma  . Heart disease Paternal Uncle        93  . Hypertension Mother   . Stroke Mother   . Drug abuse Neg Hx  Social History Social History   Tobacco Use  . Smoking status: Former Smoker    Types: Cigars  . Smokeless tobacco: Never Used  Substance Use Topics  . Alcohol use: No  . Drug use: No     Allergies   Patient has no known allergies.   Review of Systems Review of Systems  Constitutional: Positive for diaphoresis. Negative for chills and fever.  Eyes: Negative for visual disturbance.  Respiratory: Positive for shortness of breath. Negative for cough and wheezing.   Cardiovascular: Positive for chest pain. Negative for palpitations and leg swelling.  Gastrointestinal: Negative for abdominal pain, constipation, diarrhea,  nausea and vomiting.  Musculoskeletal: Negative for back pain, myalgias and neck pain.  Skin: Negative for rash and wound.  Neurological: Negative for dizziness, weakness, light-headedness, numbness and headaches.  All other systems reviewed and are negative.    Physical Exam Updated Vital Signs BP 139/81   Pulse 85   Temp 98 F (36.7 C) (Oral)   Resp 16   Ht 6' 5"  (1.956 m)   Wt 111.3 kg   SpO2 98%   BMI 29.10 kg/m   Physical Exam Vitals signs and nursing note reviewed.  Constitutional:      Appearance: He is well-developed.  HENT:     Head: Normocephalic and atraumatic.     Nose: Nose normal.     Mouth/Throat:     Mouth: Mucous membranes are moist.  Eyes:     Pupils: Pupils are equal, round, and reactive to light.  Neck:     Musculoskeletal: Normal range of motion and neck supple. No neck rigidity or muscular tenderness.  Cardiovascular:     Rate and Rhythm: Normal rate and regular rhythm.     Heart sounds: No murmur. No friction rub. No gallop.   Pulmonary:     Effort: Pulmonary effort is normal. No respiratory distress.     Breath sounds: Normal breath sounds. No stridor. No wheezing, rhonchi or rales.  Chest:     Chest wall: No tenderness.  Abdominal:     General: Bowel sounds are normal.     Palpations: Abdomen is soft.     Tenderness: There is no abdominal tenderness. There is no guarding or rebound.  Musculoskeletal: Normal range of motion.        General: No swelling, tenderness, deformity or signs of injury.     Right lower leg: No edema.     Left lower leg: No edema.  Lymphadenopathy:     Cervical: No cervical adenopathy.  Skin:    General: Skin is warm and dry.     Findings: No erythema or rash.  Neurological:     General: No focal deficit present.     Mental Status: He is alert and oriented to person, place, and time.  Psychiatric:        Behavior: Behavior normal.      ED Treatments / Results  Labs (all labs ordered are listed, but only  abnormal results are displayed) Labs Reviewed  CBC WITH DIFFERENTIAL/PLATELET - Abnormal; Notable for the following components:      Result Value   WBC 11.5 (*)    Lymphs Abs 5.2 (*)    All other components within normal limits  COMPREHENSIVE METABOLIC PANEL - Abnormal; Notable for the following components:   Potassium 3.3 (*)    Glucose, Bld 163 (*)    Creatinine, Ser 1.39 (*)    GFR calc non Af Amer 59 (*)  All other components within normal limits  TROPONIN I - Abnormal; Notable for the following components:   Troponin I 0.11 (*)    All other components within normal limits  LIPID PANEL - Abnormal; Notable for the following components:   Cholesterol 237 (*)    LDL Cholesterol 181 (*)    All other components within normal limits  TROPONIN I - Abnormal; Notable for the following components:   Troponin I 0.48 (*)    All other components within normal limits  TROPONIN I - Abnormal; Notable for the following components:   Troponin I 3.75 (*)    All other components within normal limits  TROPONIN I - Abnormal; Notable for the following components:   Troponin I 31.13 (*)    All other components within normal limits  CBC - Abnormal; Notable for the following components:   WBC 14.9 (*)    All other components within normal limits  BASIC METABOLIC PANEL - Abnormal; Notable for the following components:   Potassium 3.4 (*)    Glucose, Bld 122 (*)    Calcium 8.2 (*)    All other components within normal limits  CBC - Abnormal; Notable for the following components:   WBC 12.5 (*)    All other components within normal limits  MRSA PCR SCREENING  TROPONIN I  TROPONIN I  RAPID URINE DRUG SCREEN, HOSP PERFORMED  HEMOGLOBIN A1C  MAGNESIUM  HIV ANTIBODY (ROUTINE TESTING W REFLEX)  POCT ACTIVATED CLOTTING TIME  POCT ACTIVATED CLOTTING TIME  POCT ACTIVATED CLOTTING TIME    EKG EKG Interpretation  Date/Time:  Wednesday December 29 2018 04:07:26 EDT Ventricular Rate:  91 PR  Interval:    QRS Duration: 80 QT Interval:  350 QTC Calculation: 431 R Axis:   62 Text Interpretation:  Sinus rhythm Borderline T abnormalities, inferior leads Nonspecific T wave abnormality in inferior leads Confirmed by Ezequiel Essex [phone]) on 12/29/2018 4:35:11 AM   Radiology No results found.  Procedures Procedures (including critical care time)  Medications Ordered in ED Medications  labetalol (NORMODYNE,TRANDATE) injection 10 mg (has no administration in time range)  hydrALAZINE (APRESOLINE) injection 10 mg (has no administration in time range)  tirofiban (AGGRASTAT) infusion 50 mcg/mL 100 mL (0.15 mcg/kg/min  111.3 kg Intravenous New Bag/Given 12/30/18 0622)  sodium chloride 0.9 % bolus 500 mL (0 mLs Intravenous Stopped 12/28/18 2341)  iopamidol (ISOVUE-370) 76 % injection 100 mL (100 mLs Intravenous Contrast Given 12/28/18 2256)  ketorolac (TORADOL) 30 MG/ML injection 30 mg (30 mg Intravenous Given 12/28/18 2345)  morphine 4 MG/ML injection 4 mg (4 mg Intravenous Given 12/28/18 2345)  alum & mag hydroxide-simeth (MAALOX/MYLANTA) 200-200-20 MG/5ML suspension 30 mL (30 mLs Oral Given 12/29/18 0146)  iohexol (OMNIPAQUE) 350 MG/ML injection 100 mL (100 mLs Intravenous Contrast Given 12/29/18 0239)  potassium chloride SA (K-DUR,KLOR-CON) CR tablet 20 mEq (20 mEq Oral Given 12/29/18 0514)  HYDROmorphone (DILAUDID) injection 1 mg (1 mg Intravenous Given 12/29/18 0424)  tirofiban (AGGRASTAT) infusion 50 mcg/mL 100 mL (  Stopped 12/29/18 1900)  potassium chloride SA (K-DUR,KLOR-CON) CR tablet 20 mEq (20 mEq Oral Given 12/30/18 0925)     Initial Impression / Assessment and Plan / ED Course  I have reviewed the triage vital signs and the nursing notes.  Pertinent labs & imaging results that were available during my care of the patient were reviewed by me and considered in my medical decision making (see chart for details).        Code STEMI canceled  by cardiology team.  Initial troponin is  normal.  CT Angio of the chest without obvious abnormality including no PE or dissection.  Signed out to oncoming emergency physician pending repeat troponin.  Final Clinical Impressions(s) / ED Diagnoses   Final diagnoses:  NSTEMI (non-ST elevated myocardial infarction) [facility])    ED Discharge Orders         Ordered    aspirin 81 MG chewable tablet  Daily     12/30/18 0938    Increase activity slowly     12/30/18 0905    Diet - low sodium heart healthy     12/30/18 0905    atorvastatin (LIPITOR) 80 MG tablet  Daily-1800     12/30/18 0938    carvedilol (COREG) 6.25 MG tablet  2 times daily with meals     12/30/18 0938    nitroGLYCERIN (NITROSTAT) 0.4 MG SL tablet  Every 5 min PRN     12/30/18 0938    ticagrelor (BRILINTA) 90 MG TABS tablet  2 times daily     12/30/18 4884    Discharge instructions    Comments:  Please check your blood pressure at home, call cardiology 's office for questions regarding your blood pressure. Please bring in blood pressure records for your cardiology to review at follow up appointment.   12/30/18 0941    Amb Referral to Cardiac Rehabilitation     12/30/18 1048           Julianne Rice, MD 01/01/19 [phone]

## 2018-12-28 NOTE — ED Notes (Signed)
Patient transported to CT 

## 2018-12-29 ENCOUNTER — Telehealth: Payer: Self-pay

## 2018-12-29 ENCOUNTER — Other Ambulatory Visit (HOSPITAL_COMMUNITY): Payer: 59

## 2018-12-29 ENCOUNTER — Inpatient Hospital Stay (HOSPITAL_COMMUNITY)
Admission: EM | Disposition: A | Discharge status: Home / Self Care | Payer: Self-pay | Source: Home / Self Care | Attending: Internal Medicine

## 2018-12-29 ENCOUNTER — Encounter (HOSPITAL_COMMUNITY): Payer: Self-pay | Admitting: Radiology

## 2018-12-29 ENCOUNTER — Other Ambulatory Visit: Payer: Self-pay

## 2018-12-29 ENCOUNTER — Emergency Department (HOSPITAL_COMMUNITY): Payer: 59

## 2018-12-29 DIAGNOSIS — I200 Unstable angina: Secondary | ICD-10-CM | POA: Diagnosis not present

## 2018-12-29 DIAGNOSIS — I214 Non-ST elevation (NSTEMI) myocardial infarction: Secondary | ICD-10-CM | POA: Diagnosis present

## 2018-12-29 DIAGNOSIS — N182 Chronic kidney disease, stage 2 (mild): Secondary | ICD-10-CM

## 2018-12-29 DIAGNOSIS — Z7982 Long term (current) use of aspirin: Secondary | ICD-10-CM | POA: Diagnosis not present

## 2018-12-29 DIAGNOSIS — I129 Hypertensive chronic kidney disease with stage 1 through stage 4 chronic kidney disease, or unspecified chronic kidney disease: Secondary | ICD-10-CM | POA: Diagnosis not present

## 2018-12-29 DIAGNOSIS — Z87891 Personal history of nicotine dependence: Secondary | ICD-10-CM | POA: Diagnosis not present

## 2018-12-29 DIAGNOSIS — I249 Acute ischemic heart disease, unspecified: Secondary | ICD-10-CM

## 2018-12-29 DIAGNOSIS — E876 Hypokalemia: Secondary | ICD-10-CM | POA: Diagnosis not present

## 2018-12-29 DIAGNOSIS — E785 Hyperlipidemia, unspecified: Secondary | ICD-10-CM | POA: Diagnosis not present

## 2018-12-29 DIAGNOSIS — Z807 Family history of other malignant neoplasms of lymphoid, hematopoietic and related tissues: Secondary | ICD-10-CM | POA: Diagnosis not present

## 2018-12-29 DIAGNOSIS — I2510 Atherosclerotic heart disease of native coronary artery without angina pectoris: Secondary | ICD-10-CM | POA: Diagnosis not present

## 2018-12-29 DIAGNOSIS — I252 Old myocardial infarction: Secondary | ICD-10-CM | POA: Diagnosis present

## 2018-12-29 DIAGNOSIS — N179 Acute kidney failure, unspecified: Secondary | ICD-10-CM | POA: Diagnosis not present

## 2018-12-29 DIAGNOSIS — E7800 Pure hypercholesterolemia, unspecified: Secondary | ICD-10-CM | POA: Diagnosis not present

## 2018-12-29 DIAGNOSIS — R7989 Other specified abnormal findings of blood chemistry: Secondary | ICD-10-CM

## 2018-12-29 DIAGNOSIS — I2582 Chronic total occlusion of coronary artery: Secondary | ICD-10-CM | POA: Diagnosis not present

## 2018-12-29 DIAGNOSIS — Z79899 Other long term (current) drug therapy: Secondary | ICD-10-CM | POA: Diagnosis not present

## 2018-12-29 DIAGNOSIS — Z8249 Family history of ischemic heart disease and other diseases of the circulatory system: Secondary | ICD-10-CM | POA: Diagnosis not present

## 2018-12-29 DIAGNOSIS — R079 Chest pain, unspecified: Secondary | ICD-10-CM | POA: Diagnosis not present

## 2018-12-29 DIAGNOSIS — I10 Essential (primary) hypertension: Secondary | ICD-10-CM | POA: Diagnosis not present

## 2018-12-29 DIAGNOSIS — E669 Obesity, unspecified: Secondary | ICD-10-CM | POA: Diagnosis not present

## 2018-12-29 DIAGNOSIS — Z6829 Body mass index (BMI) 29.0-29.9, adult: Secondary | ICD-10-CM | POA: Diagnosis not present

## 2018-12-29 HISTORY — PX: CORONARY STENT INTERVENTION: CATH118234

## 2018-12-29 HISTORY — PX: LEFT HEART CATH AND CORONARY ANGIOGRAPHY: CATH118249

## 2018-12-29 LAB — RAPID URINE DRUG SCREEN, HOSP PERFORMED
Amphetamines: NOT DETECTED
Barbiturates: NOT DETECTED
Benzodiazepines: NOT DETECTED
Cocaine: NOT DETECTED
Opiates: NOT DETECTED
Tetrahydrocannabinol: NOT DETECTED

## 2018-12-29 LAB — CBC
HCT: 42.9 % (ref 39.0–52.0)
Hemoglobin: 14.7 g/dL (ref 13.0–17.0)
MCH: 29.9 pg (ref 26.0–34.0)
MCHC: 34.3 g/dL (ref 30.0–36.0)
MCV: 87.4 fL (ref 80.0–100.0)
Platelets: 176 10*3/uL (ref 150–400)
RBC: 4.91 MIL/uL (ref 4.22–5.81)
RDW: 13 % (ref 11.5–15.5)
WBC: 14.9 10*3/uL — ABNORMAL HIGH (ref 4.0–10.5)
nRBC: 0 % (ref 0.0–0.2)

## 2018-12-29 LAB — TROPONIN I
Troponin I: 0.11 ng/mL (ref <0.03)
Troponin I: 0.48 ng/mL
Troponin I: 3.75 ng/mL
Troponin I: 31.13 ng/mL
Troponin I: <0.03 ng/mL

## 2018-12-29 LAB — MRSA PCR SCREENING: MRSA by PCR: NEGATIVE

## 2018-12-29 LAB — HEMOGLOBIN A1C
Hgb A1c MFr Bld: 5.4 % (ref 4.8–5.6)
Mean Plasma Glucose: 108.28 mg/dL

## 2018-12-29 LAB — LIPID PANEL
Cholesterol: 237 mg/dL — ABNORMAL HIGH (ref 0–200)
HDL: 50 mg/dL (ref >40)
LDL Cholesterol: 181 mg/dL — ABNORMAL HIGH (ref 0–99)
Total CHOL/HDL Ratio: 4.7 RATIO
Triglycerides: 32 mg/dL (ref <150)
VLDL: 6 mg/dL (ref 0–40)

## 2018-12-29 LAB — MAGNESIUM: Magnesium: 1.8 mg/dL (ref 1.7–2.4)

## 2018-12-29 LAB — POCT ACTIVATED CLOTTING TIME
Activated Clotting Time: 318 seconds
Activated Clotting Time: 439 seconds
Activated Clotting Time: 588 seconds

## 2018-12-29 LAB — HIV ANTIBODY (ROUTINE TESTING W REFLEX): HIV Screen 4th Generation wRfx: NONREACTIVE

## 2018-12-29 SURGERY — LEFT HEART CATH AND CORONARY ANGIOGRAPHY
Anesthesia: LOCAL

## 2018-12-29 MED ORDER — ONDANSETRON HCL 4 MG/2ML IJ SOLN
4.0000 mg | Freq: Four times a day (QID) | INTRAMUSCULAR | Status: DC | PRN
  Administered 2018-12-29: 07:00:00 4 mg via INTRAVENOUS
  Filled 2018-12-29: qty 2

## 2018-12-29 MED ORDER — ASPIRIN 81 MG PO CHEW
81.0000 mg | CHEWABLE_TABLET | Freq: Every day | ORAL | Status: DC
  Administered 2018-12-30: 81 mg via ORAL
  Filled 2018-12-29: qty 1

## 2018-12-29 MED ORDER — SODIUM CHLORIDE 0.9 % IV SOLN: INTRAVENOUS | Status: DC

## 2018-12-29 MED ORDER — MORPHINE SULFATE (PF) 2 MG/ML IV SOLN: 2.0000 mg | INTRAVENOUS | Status: DC | PRN

## 2018-12-29 MED ORDER — ALUM & MAG HYDROXIDE-SIMETH 200-200-20 MG/5ML PO SUSP
30.0000 mL | Freq: Once | ORAL | Status: AC
  Administered 2018-12-29: 30 mL via ORAL
  Filled 2018-12-29: qty 30

## 2018-12-29 MED ORDER — ZOLPIDEM TARTRATE 5 MG PO TABS: 5.0000 mg | ORAL_TABLET | Freq: Every evening | ORAL | Status: DC | PRN

## 2018-12-29 MED ORDER — ACETAMINOPHEN 325 MG PO TABS: 650.0000 mg | ORAL_TABLET | ORAL | Status: DC | PRN

## 2018-12-29 MED ORDER — HYDRALAZINE HCL 20 MG/ML IJ SOLN: 5.0000 mg | INTRAMUSCULAR | Status: DC | PRN

## 2018-12-29 MED ORDER — SODIUM CHLORIDE 0.9 % WEIGHT BASED INFUSION: 3.0000 mL/kg/h | INTRAVENOUS | Status: DC

## 2018-12-29 MED ORDER — HEPARIN (PORCINE) IN NACL 1000-0.9 UT/500ML-% IV SOLN
INTRAVENOUS | Status: AC
  Filled 2018-12-29: qty 1000

## 2018-12-29 MED ORDER — VERAPAMIL HCL 2.5 MG/ML IV SOLN
INTRAVENOUS | Status: AC
  Filled 2018-12-29: qty 2

## 2018-12-29 MED ORDER — MIDAZOLAM HCL 2 MG/2ML IJ SOLN
INTRAMUSCULAR | Status: DC | PRN
  Administered 2018-12-29: 2 mg via INTRAVENOUS

## 2018-12-29 MED ORDER — TICAGRELOR 90 MG PO TABS
ORAL_TABLET | ORAL | Status: AC
  Filled 2018-12-29: qty 1

## 2018-12-29 MED ORDER — LABETALOL HCL 5 MG/ML IV SOLN: 10.0000 mg | INTRAVENOUS | Status: AC | PRN

## 2018-12-29 MED ORDER — NITROGLYCERIN 0.4 MG SL SUBL
0.4000 mg | SUBLINGUAL_TABLET | SUBLINGUAL | Status: DC | PRN
  Administered 2018-12-29 (×2): 0.4 mg via SUBLINGUAL
  Filled 2018-12-29 (×2): qty 1

## 2018-12-29 MED ORDER — DIAZEPAM 5 MG PO TABS: 5.0000 mg | ORAL_TABLET | Freq: Four times a day (QID) | ORAL | Status: DC | PRN

## 2018-12-29 MED ORDER — TIROFIBAN (AGGRASTAT) BOLUS VIA INFUSION
INTRAVENOUS | Status: DC | PRN
  Administered 2018-12-29: 2782.5 ug via INTRAVENOUS

## 2018-12-29 MED ORDER — CARVEDILOL 3.125 MG PO TABS
3.1250 mg | ORAL_TABLET | Freq: Two times a day (BID) | ORAL | Status: DC
  Administered 2018-12-29: 3.125 mg via ORAL
  Filled 2018-12-29 (×2): qty 1

## 2018-12-29 MED ORDER — FENTANYL CITRATE (PF) 100 MCG/2ML IJ SOLN
INTRAMUSCULAR | Status: AC
  Filled 2018-12-29: qty 2

## 2018-12-29 MED ORDER — SODIUM CHLORIDE 0.9% FLUSH
3.0000 mL | Freq: Two times a day (BID) | INTRAVENOUS | Status: DC
  Administered 2018-12-29 – 2018-12-30 (×2): 3 mL via INTRAVENOUS

## 2018-12-29 MED ORDER — HYDROMORPHONE HCL 1 MG/ML IJ SOLN
1.0000 mg | Freq: Once | INTRAMUSCULAR | Status: AC
  Administered 2018-12-29: 04:00:00 1 mg via INTRAVENOUS
  Filled 2018-12-29: qty 1

## 2018-12-29 MED ORDER — NITROGLYCERIN 1 MG/10 ML FOR IR/CATH LAB
INTRA_ARTERIAL | Status: DC | PRN
  Administered 2018-12-29: 100 ug via INTRACORONARY
  Administered 2018-12-29 (×4): 200 ug via INTRACORONARY

## 2018-12-29 MED ORDER — SODIUM CHLORIDE 0.9% FLUSH: 3.0000 mL | INTRAVENOUS | Status: DC | PRN

## 2018-12-29 MED ORDER — HEPARIN SODIUM (PORCINE) 1000 UNIT/ML IJ SOLN
INTRAMUSCULAR | Status: DC | PRN
  Administered 2018-12-29: 5500 [IU] via INTRAVENOUS
  Administered 2018-12-29: 2000 [IU] via INTRAVENOUS
  Administered 2018-12-29: 6000 [IU] via INTRAVENOUS

## 2018-12-29 MED ORDER — SODIUM CHLORIDE 0.9 % WEIGHT BASED INFUSION: 1.0000 mL/kg/h | INTRAVENOUS | Status: DC

## 2018-12-29 MED ORDER — PANTOPRAZOLE SODIUM 40 MG PO TBEC
40.0000 mg | DELAYED_RELEASE_TABLET | Freq: Every day | ORAL | Status: DC
  Administered 2018-12-29: 40 mg via ORAL
  Filled 2018-12-29: qty 1

## 2018-12-29 MED ORDER — TIROFIBAN HCL IN NACL 5-0.9 MG/100ML-% IV SOLN
INTRAVENOUS | Status: AC
  Filled 2018-12-29: qty 100

## 2018-12-29 MED ORDER — SODIUM CHLORIDE 0.9 % IV SOLN: 250.0000 mL | INTRAVENOUS | Status: DC | PRN

## 2018-12-29 MED ORDER — TIROFIBAN HCL IN NACL 5-0.9 MG/100ML-% IV SOLN
0.1500 ug/kg/min | INTRAVENOUS | Status: AC
  Administered 2018-12-29 – 2018-12-30 (×3): 0.15 ug/kg/min via INTRAVENOUS
  Filled 2018-12-29 (×4): qty 100

## 2018-12-29 MED ORDER — TIROFIBAN HCL IN NACL 5-0.9 MG/100ML-% IV SOLN
INTRAVENOUS | Status: AC | PRN
  Administered 2018-12-29: 0.15 ug/kg/min
  Administered 2018-12-29: 0.15 ug/kg/min via INTRAVENOUS

## 2018-12-29 MED ORDER — ENOXAPARIN SODIUM 40 MG/0.4ML ~~LOC~~ SOLN
40.0000 mg | SUBCUTANEOUS | Status: DC
  Administered 2018-12-30: 40 mg via SUBCUTANEOUS
  Filled 2018-12-29: qty 0.4

## 2018-12-29 MED ORDER — NITROGLYCERIN 1 MG/10 ML FOR IR/CATH LAB
INTRA_ARTERIAL | Status: AC
  Filled 2018-12-29: qty 10

## 2018-12-29 MED ORDER — ASPIRIN EC 325 MG PO TBEC
325.0000 mg | DELAYED_RELEASE_TABLET | Freq: Every day | ORAL | Status: DC
  Administered 2018-12-29: 09:00:00 325 mg via ORAL
  Filled 2018-12-29: qty 1

## 2018-12-29 MED ORDER — ATORVASTATIN CALCIUM 80 MG PO TABS: 80.0000 mg | ORAL_TABLET | Freq: Every day | ORAL | Status: DC

## 2018-12-29 MED ORDER — ATORVASTATIN CALCIUM 40 MG PO TABS
40.0000 mg | ORAL_TABLET | Freq: Every day | ORAL | Status: DC
  Administered 2018-12-29: 40 mg via ORAL
  Filled 2018-12-29: qty 1

## 2018-12-29 MED ORDER — ASPIRIN 81 MG PO CHEW: 81.0000 mg | CHEWABLE_TABLET | ORAL | Status: DC

## 2018-12-29 MED ORDER — IOHEXOL 350 MG/ML SOLN
INTRAVENOUS | Status: DC | PRN
  Administered 2018-12-29: 295 mL via INTRA_ARTERIAL

## 2018-12-29 MED ORDER — LIDOCAINE HCL (PF) 1 % IJ SOLN
INTRAMUSCULAR | Status: DC | PRN
  Administered 2018-12-29: 2 mL

## 2018-12-29 MED ORDER — ATORVASTATIN CALCIUM 80 MG PO TABS
80.0000 mg | ORAL_TABLET | Freq: Every day | ORAL | Status: DC
  Administered 2018-12-29: 20:00:00 80 mg via ORAL
  Filled 2018-12-29: qty 1

## 2018-12-29 MED ORDER — HEPARIN (PORCINE) IN NACL 1000-0.9 UT/500ML-% IV SOLN
INTRAVENOUS | Status: DC | PRN
  Administered 2018-12-29 (×2): 500 mL

## 2018-12-29 MED ORDER — TICAGRELOR 90 MG PO TABS
90.0000 mg | ORAL_TABLET | Freq: Two times a day (BID) | ORAL | Status: DC
  Administered 2018-12-29 – 2018-12-30 (×2): 90 mg via ORAL
  Filled 2018-12-29 (×2): qty 1

## 2018-12-29 MED ORDER — ONDANSETRON HCL 4 MG/2ML IJ SOLN: 4.0000 mg | Freq: Four times a day (QID) | INTRAMUSCULAR | Status: DC | PRN

## 2018-12-29 MED ORDER — HYDRALAZINE HCL 20 MG/ML IJ SOLN: 10.0000 mg | INTRAMUSCULAR | Status: AC | PRN

## 2018-12-29 MED ORDER — POTASSIUM CHLORIDE CRYS ER 20 MEQ PO TBCR
20.0000 meq | EXTENDED_RELEASE_TABLET | Freq: Once | ORAL | Status: AC
  Administered 2018-12-29: 20 meq via ORAL
  Filled 2018-12-29: qty 1

## 2018-12-29 MED ORDER — MIDAZOLAM HCL 2 MG/2ML IJ SOLN
INTRAMUSCULAR | Status: AC
  Filled 2018-12-29: qty 2

## 2018-12-29 MED ORDER — TICAGRELOR 90 MG PO TABS
ORAL_TABLET | ORAL | Status: DC | PRN
  Administered 2018-12-29: 180 mg via ORAL

## 2018-12-29 MED ORDER — SODIUM CHLORIDE 0.9% FLUSH: 3.0000 mL | Freq: Two times a day (BID) | INTRAVENOUS | Status: DC

## 2018-12-29 MED ORDER — FENTANYL CITRATE (PF) 100 MCG/2ML IJ SOLN
INTRAMUSCULAR | Status: DC | PRN
  Administered 2018-12-29: 50 ug via INTRAVENOUS

## 2018-12-29 MED ORDER — VERAPAMIL HCL 2.5 MG/ML IV SOLN
INTRAVENOUS | Status: DC | PRN
  Administered 2018-12-29: 10 mL via INTRA_ARTERIAL

## 2018-12-29 MED ORDER — LIDOCAINE HCL (PF) 1 % IJ SOLN
INTRAMUSCULAR | Status: AC
  Filled 2018-12-29: qty 30

## 2018-12-29 MED ORDER — ALUM & MAG HYDROXIDE-SIMETH 200-200-20 MG/5ML PO SUSP: 30.0000 mL | ORAL | Status: DC | PRN

## 2018-12-29 MED ORDER — HEPARIN SODIUM (PORCINE) 1000 UNIT/ML IJ SOLN
INTRAMUSCULAR | Status: AC
  Filled 2018-12-29: qty 1

## 2018-12-29 MED ORDER — SODIUM CHLORIDE 0.9 % IV SOLN
INTRAVENOUS | Status: DC
  Administered 2018-12-29 (×3): via INTRAVENOUS

## 2018-12-29 MED ORDER — IOHEXOL 350 MG/ML SOLN
100.0000 mL | Freq: Once | INTRAVENOUS | Status: AC | PRN
  Administered 2018-12-29: 100 mL via INTRAVENOUS

## 2018-12-29 SURGICAL SUPPLY — 21 items
BALLN SAPPHIRE 2.0X12 (BALLOONS) ×2
BALLN SAPPHIRE ~~LOC~~ 2.75X8 (BALLOONS) ×2 IMPLANT
BALLOON SAPPHIRE 2.0X12 (BALLOONS) ×1 IMPLANT
CATH INFINITI 5 FR JL3.5 (CATHETERS) ×2 IMPLANT
CATH INFINITI 5FR ANG PIGTAIL (CATHETERS) ×2 IMPLANT
CATH LAUNCHER 5F EBU3.5 (CATHETERS) ×2 IMPLANT
CATH LAUNCHER 6FR JR4 (CATHETERS) ×2 IMPLANT
CATH OPTITORQUE TIG 4.0 5F (CATHETERS) ×2 IMPLANT
COVER DOME SNAP 22 D (MISCELLANEOUS) ×2 IMPLANT
DEVICE RAD COMP TR BAND LRG (VASCULAR PRODUCTS) ×2 IMPLANT
GLIDESHEATH SLEND SS 6F .021 (SHEATH) ×2 IMPLANT
KIT ENCORE 26 ADVANTAGE (KITS) ×2 IMPLANT
KIT HEART LEFT (KITS) ×2 IMPLANT
PACK CARDIAC CATHETERIZATION (CUSTOM PROCEDURE TRAY) ×2 IMPLANT
SHEATH PROBE COVER 6X72 (BAG) ×2 IMPLANT
STENT RESOLUTE ONYX 2.5X12 (Permanent Stent) ×2 IMPLANT
SYR MEDRAD MARK 7 150ML (SYRINGE) ×2 IMPLANT
TRANSDUCER W/STOPCOCK (MISCELLANEOUS) ×2 IMPLANT
TUBING CIL FLEX 10 FLL-RA (TUBING) ×2 IMPLANT
WIRE HI TORQ BMW 190CM (WIRE) ×2 IMPLANT
WIRE PT2 MS 185 (WIRE) ×2 IMPLANT

## 2018-12-29 NOTE — H&P (View-Only) (Signed)
He continues to have 2/10 pressure-like precordial chest pain consistent with angina. CG shows subtle but new T wave inversion in the inferior leads and T wave flattening in the lateral leads. Mild increase in cardiac troponin I. Recommend coronary angiography and percutaneous revascularization as appropriate. Severely elevated LDL cholesterol is noted.  The diagnostic cardiac catheterization as well as possible angioplasty-stent procedure has been fully reviewed with the patient and written informed consent has been obtained.  Sanda Klein, MD, [facility] [phone] office [phone] pager

## 2018-12-29 NOTE — ED Notes (Signed)
ED TO INPATIENT HANDOFF REPORT  ED Nurse Name and Phone #: [phone]  S Name/Age/Gender Dylan Haynes 48 y.o. male Room/Bed: TRAAC/TRAAC  Code Status   Code Status: Full Code  Home/SNF/Other Home Patient oriented to: self, place, time and situation Is this baseline? Yes   Triage Complete: Triage complete  Chief Complaint code stemi  Triage Note No notes on file   Allergies No Known Allergies  Level of Care/Admitting Diagnosis ED Disposition    ED Disposition Condition [place]: [place] [100100]  Level of Care: Telemetry Cardiac [103]  I expect the patient will be discharged within 24 hours: No (not a candidate for 5C-Observation unit)  Diagnosis: Chest pain [287867]  Admitting Physician: Ivor Costa [[id number]]  Attending Physician: Ivor Costa [[id number]]  PT Class (Do Not Modify): Observation [104]  PT Acc Code (Do Not Modify): Observation [10022]       B Medical/Surgery History Past Medical History:  Diagnosis Date  . Hypertension    No past surgical history on file.   A IV Location/Drains/Wounds Patient Lines/Drains/Airways Status   Active Line/Drains/Airways    Name:   Placement date:   Placement time:   Site:   Days:   Peripheral IV 12/28/18 Left Antecubital   12/28/18    2201    Antecubital   1   Peripheral IV 12/28/18 Right Antecubital   12/28/18    2201    Antecubital   1          Intake/Output Last 24 hours No intake or output data in the 24 hours ending 12/29/18 0413  Labs/Imaging Results for orders placed or performed during the hospital encounter of 12/28/18 (from the past 48 hour(s))  CBC with Differential/Platelet     Status: Abnormal   Collection Time: 12/28/18  9:57 PM  Result Value Ref Range   WBC 11.5 (H) 4.0 - 10.5 K/uL   RBC 5.27 4.22 - 5.81 MIL/uL   Hemoglobin 15.6 13.0 - 17.0 g/dL   HCT 46.1 39.0 - 52.0 %   MCV 87.5 80.0 - 100.0 fL   MCH 29.6 26.0 - 34.0 pg   MCHC 33.8 30.0 - 36.0  g/dL   RDW 12.5 11.5 - 15.5 %   Platelets 191 150 - 400 K/uL   nRBC 0.0 0.0 - 0.2 %   Neutrophils Relative % 47 %   Neutro Abs 5.3 1.7 - 7.7 K/uL   Lymphocytes Relative 45 %   Lymphs Abs 5.2 (H) 0.7 - 4.0 K/uL   Monocytes Relative 8 %   Monocytes Absolute 0.9 0.1 - 1.0 K/uL   Eosinophils Relative 0 %   Eosinophils Absolute 0.0 0.0 - 0.5 K/uL   Basophils Relative 0 %   Basophils Absolute 0.0 0.0 - 0.1 K/uL   Immature Granulocytes 0 %   Abs Immature Granulocytes 0.04 0.00 - 0.07 K/uL    Comment: Performed at [facility], [address] [place], [place] [address]  Comprehensive metabolic panel     Status: Abnormal   Collection Time: 12/28/18  9:57 PM  Result Value Ref Range   Sodium 139 135 - 145 mmol/L   Potassium 3.3 (L) 3.5 - 5.1 mmol/L   Chloride 103 98 - 111 mmol/L   CO2 26 22 - 32 mmol/L   Glucose, Bld 163 (H) 70 - 99 mg/dL   BUN 11 6 - 20 mg/dL   Creatinine, Ser 1.39 (H) 0.61 - 1.24 mg/dL  Calcium 9.4 8.9 - 10.3 mg/dL   Total Protein 7.8 6.5 - 8.1 g/dL   Albumin 4.6 3.5 - 5.0 g/dL   AST 27 15 - 41 U/L   ALT 32 0 - 44 U/L   Alkaline Phosphatase 69 38 - 126 U/L   Total Bilirubin 1.1 0.3 - 1.2 mg/dL   GFR calc non Af Amer 59 (L) >60 mL/min   GFR calc Af Amer >60 >60 mL/min   Anion gap 10 5 - 15    Comment: Performed at [facility] [address] [place], [place] [address]  Troponin I - ONCE - STAT     Status: None   Collection Time: 12/28/18  9:57 PM  Result Value Ref Range   Troponin I <0.03 <0.03 ng/mL    Comment: Performed at [facility], [place] [address] [place], [place] [address]  Troponin I - Once-Timed     Status: None   Collection Time: 12/28/18 11:48 PM  Result Value Ref Range   Troponin I <0.03 <0.03 ng/mL    Comment: Performed at [facility] [address], [place],  [address]  Troponin I - ONCE - STAT     Status: Abnormal   Collection Time: 12/29/18  2:23 AM  Result Value Ref Range   Troponin I 0.11 (HH) <0.03  ng/mL    Comment: CRITICAL RESULT CALLED TO, READ BACK BY AND VERIFIED WITH: W.Talbert Nan [id number] 12/29/2018 M.CAMPBELL Performed at [facility], [place] [address], [place], [place] [address]    Ct Angio Chest Pe W And/or Wo Contrast  Result Date: 12/28/2018 CLINICAL DATA:  48 year old male with concern for pulmonary embolism. EXAM: CT ANGIOGRAPHY CHEST WITH CONTRAST TECHNIQUE: Multidetector CT imaging of the chest was performed using the standard protocol during bolus administration of intravenous contrast. Multiplanar CT image reconstructions and MIPs were obtained to evaluate the vascular anatomy. CONTRAST:  125mL ISOVUE-370 IOPAMIDOL (ISOVUE-370) INJECTION 76% COMPARISON:  None. FINDINGS: Cardiovascular: There is no cardiomegaly or pericardial effusion. The thoracic aorta is unremarkable for the degree of opacification. There is no CT evidence of pulmonary embolism. Mediastinum/Nodes: No hilar or mediastinal adenopathy. The esophagus is grossly unremarkable. No mediastinal fluid collection. Lungs/Pleura: The lungs are clear. There is no pleural effusion or pneumothorax. The central airways are patent. Upper Abdomen: No acute abnormality. Musculoskeletal: No chest wall abnormality. No acute or significant osseous findings. Review of the MIP images confirms the above findings. IMPRESSION: No acute intrathoracic pathology. No CT evidence of pulmonary embolism. Electronically Signed   By: Anner Crete M.D.   On: 12/28/2018 23:25   Ct Angio Chest/abd/pel For Dissection W And/or Wo Contrast  Result Date: 12/29/2018 CLINICAL DATA:  Chest pain. Suspect abdominal aortic aneurysm. History of hypertension. EXAM: CT ANGIOGRAPHY CHEST, ABDOMEN AND PELVIS TECHNIQUE: Multidetector CT imaging through the chest, abdomen and pelvis was performed using the standard protocol during bolus administration of intravenous contrast. Multiplanar reconstructed images and MIPs were obtained and reviewed to evaluate the  vascular anatomy. CONTRAST:  186mL OMNIPAQUE IOHEXOL 350 MG/ML SOLN COMPARISON:  CT angiogram chest December 28, 2018. FINDINGS: CTA CHEST FINDINGS CARDIOVASCULAR: Thoracic aorta is normal course and caliber. No intrinsic density on noncontrast CT. Homogeneous contrast opacification of thoracic aorta without dissection, aneurysm, luminal irregularity, periaortic fluid collections, or contrast extravasation. Heart size is normal. No pericardial effusion. No pulmonary embolism though not tailored for evaluation. MEDIASTINUM/NODES: No mediastinal mass or lymphadenopathy by CT size criteria. LUNGS/PLEURA: Tracheobronchial tree is patent,  no pneumothorax. No pleural effusions, focal consolidations, pulmonary nodules or masses. MUSCULOSKELETAL: Non-suspicious. Review of the MIP images confirms the above findings. CTA ABDOMEN AND PELVIS FINDINGS VASCULAR Aorta: Abdominal aorta is normal course and caliber. Homogeneous contrast opacification of aortoiliac vessels without dissection, aneurysm, luminal irregularity, periaortic fluid collections, or contrast extravasation. Celiac: Patent. SMA: Patent. Renals: Patent. IMA: Patent. Inflow: Negative. Veins: Negative, not tailored for evaluation. Review of the MIP images confirms the above findings. NON-VASCULAR HEPATOBILIARY: Liver and gallbladder are normal. PANCREAS: Normal. SPLEEN: Normal. ADRENALS/URINARY TRACT: Kidneys are orthotopic, demonstrating symmetric enhancement. No obstructive uropathy or suspicious renal masses. Excretion of contrast and normal urinary collecting system from prior CTA chest limiting detection of small nonobstructing nephrolithiasis. Urinary bladder is partially distended and unremarkable. Normal adrenal glands. STOMACH/BOWEL: The stomach, small and large bowel are normal in course and caliber without inflammatory changes. Normal appendix. VASCULAR/LYMPHATIC: No lymphadenopathy by CT size criteria. REPRODUCTIVE: Normal. OTHER: No intraperitoneal free  fluid or free air. MUSCULOSKELETAL: Nonacute. Review of the MIP images confirms the above findings. IMPRESSION: CTA CHEST: 1. Normal CTA chest; no acute cardiopulmonary disease. CTA ABDOMEN AND PELVIS: 1. Normal CTA abdomen and pelvis; no acute intra-abdominal/pelvic disease. Electronically Signed   By: Elon Alas M.D.   On: 12/29/2018 03:03    Pending Labs Unresulted Labs (From admission, onward)    Start     Ordered   12/29/18 0500  Hemoglobin A1c  Tomorrow morning,   R     12/29/18 0404   12/29/18 0500  Lipid panel  Tomorrow morning,   R    Comments:  Please obtain as a fasting lipid panel - should not have eaten/ drank food for 8 hours prior to labs.    12/29/18 0404   12/29/18 0500  Magnesium  Tomorrow morning,   R     12/29/18 0404   12/29/18 0406  HIV antibody (Routine Testing)  Once,   R     12/29/18 0406   12/29/18 0403  Rapid urine drug screen (hospital performed)  Once,   R     12/29/18 0404   12/29/18 0403  Troponin I - Now Then Q6H  Now then every 6 hours,   R     12/29/18 0404          Vitals/Pain Today's Vitals   12/29/18 0315 12/29/18 0330 12/29/18 0345 12/29/18 0400  BP: (!) 143/97 (!) 133/92 (!) 136/95 (!) 137/93  Pulse: 93 90 94 83  Resp: 19 16 18 18   SpO2: 96% 94% 97% 93%  Weight:      Height:      PainSc:    8     Isolation Precautions No active isolations  Medications Medications  aspirin EC tablet 325 mg (has no administration in time range)  nitroGLYCERIN (NITROSTAT) SL tablet 0.4 mg (has no administration in time range)  morphine 2 MG/ML injection 2 mg (has no administration in time range)  potassium chloride SA (K-DUR,KLOR-CON) CR tablet 20 mEq (has no administration in time range)  alum & mag hydroxide-simeth (MAALOX/MYLANTA) 200-200-20 MG/5ML suspension 30 mL (has no administration in time range)  pantoprazole (PROTONIX) EC tablet 40 mg (has no administration in time range)  acetaminophen (TYLENOL) tablet 650 mg (has no administration  in time range)  ondansetron (ZOFRAN) injection 4 mg (has no administration in time range)  enoxaparin (LOVENOX) injection 40 mg (has no administration in time range)  0.9 %  sodium chloride infusion (has no administration in time range)  hydrALAZINE (  APRESOLINE) injection 5 mg (has no administration in time range)  sodium chloride 0.9 % bolus 500 mL (0 mLs Intravenous Stopped 12/28/18 2341)  iopamidol (ISOVUE-370) 76 % injection 100 mL (100 mLs Intravenous Contrast Given 12/28/18 2256)  ketorolac (TORADOL) 30 MG/ML injection 30 mg (30 mg Intravenous Given 12/28/18 2345)  morphine 4 MG/ML injection 4 mg (4 mg Intravenous Given 12/28/18 2345)  alum & mag hydroxide-simeth (MAALOX/MYLANTA) 200-200-20 MG/5ML suspension 30 mL (30 mLs Oral Given 12/29/18 0146)  iohexol (OMNIPAQUE) 350 MG/ML injection 100 mL (100 mLs Intravenous Contrast Given 12/29/18 0239)    Mobility walks Low fall risk   Focused Assessments Cardiac Assessment Handoff:  Cardiac Rhythm: Normal sinus rhythm Lab Results  Component Value Date   TROPONINI 0.11 ([place]) 12/29/2018   No results found for: DDIMER Does the Patient currently have chest pain? Yes  , Pulmonary Assessment Handoff:  Lung sounds:            R Recommendations: See Admitting Provider Note  Report given to:   Additional Notes:  No hx except HTN

## 2018-12-29 NOTE — Progress Notes (Signed)
Transferred -in from cardiac cath by bed awake and alert. TR Band to right radial intact. Instructed not to move right arm, elevated with pillow. Pulse ox to right  Thumb  In placed.

## 2018-12-29 NOTE — Progress Notes (Signed)
After 3 ml of air removed from the TR band noted with slight swelling above the TR band. Manual compression applied. Cath lab staff came to evaluate at once. Tr band readjusted  Slightly by cardiac lab staff and inflated with 1 ml of air total 14 ml. Swelling  subsided and bleeding  Stopped. Continue to monitor.

## 2018-12-29 NOTE — Procedures (Signed)
Could not complete echo today since patient was off the floor most of the day.

## 2018-12-29 NOTE — Progress Notes (Signed)
TR band site bleeding noted. Inflated with additional 4 ml of air.total 36ml of air.elevated with pillow. Site is soft. Latest troponin level-13.31 text paged to NP , awaiting call.

## 2018-12-29 NOTE — Progress Notes (Signed)
Called ED to ck about STEMI pg.  Per ED, insufficient info at this time.  STEMI was subsequently cancelled.  Chaplain remains available via pg.  Myra Gianotti resident, [phone]

## 2018-12-29 NOTE — Telephone Encounter (Signed)
Copied from [place] [id number]. Topic: General - Other >> Dec 29, 2018  9:35 AM Yvette Rack wrote: Reason for CRM: Pt wife Beckie Busing stated pt had a mild heart attack last night and she wanted to make Dr. Derrel Nip aware.

## 2018-12-29 NOTE — Progress Notes (Signed)
After shift report, went to check on patient. Patient was resting comfortably in bed. Patient stated that his chest pain was a 2/10. Asked the patient if he wanted Morphine or Nitroglycerin to help resolve that pain. Patient refused at this time. Told patient that if the pain worsened to please let me know. Will continue to monitor.

## 2018-12-29 NOTE — Telephone Encounter (Signed)
Thank you for letting me know  He will need a hospital follow up scheduled

## 2018-12-29 NOTE — ED Notes (Signed)
Ricco Dershem (wife) @ [phone]

## 2018-12-29 NOTE — Progress Notes (Addendum)
He continues to have 2/10 pressure-like precordial chest pain consistent with angina. CG shows subtle but new T wave inversion in the inferior leads and T wave flattening in the lateral leads. Mild increase in cardiac troponin I. Recommend coronary angiography and percutaneous revascularization as appropriate. Severely elevated LDL cholesterol is noted.  The diagnostic cardiac catheterization as well as possible angioplasty-stent procedure has been fully reviewed with the patient and written informed consent has been obtained.  Sanda Klein, MD, [facility] [phone] office [phone] pager

## 2018-12-29 NOTE — H&P (Signed)
History and Physical    Dylan Haynes IHK:[id number] DOB: Aug 10, 1971 DOA: 12/28/2018  Referring MD/NP/PA:   PCP: Crecencio Mc, MD   Patient coming from:  The patient is coming from home.  At baseline, pt is independent for most of ADL.        Chief Complaint: chest pain  HPI: Dylan Haynes is a 48 y.o. male with medical history significant of hypertension, hyperlipidemia, obesity, CKD-2, who presents with chest pain.  Patient states that his chest pain started about 6 PM.  It is located in the central chest, sharp, 10 out of 10 in severity, radiating to the left arm.  It is associated with mild shortness of breath and diaphoresis.  Patient does not have cough, fever or chills.  No long distance traveling recently.  No tenderness in the calf areas.  Patient denies nausea vomiting, diarrhea, abdominal pain, symptoms of UTI or unilateral weakness.  Per EMS patient was diaphoretic and EKG demonstrated possible ST segment elevation in the inferior leads.  Code STEMI was called, which was canceled by cardiology after reviewed EKG.    ED Course: pt was found to have troponin 0.03, 0.03, 0.11, WBC 11.5, potassium 3.3, worsening renal function, heart rate 50-101, oxygen saturation 95% on room air, blood pressure 163/99.  CT angiogram of chest and CT angiogram of chest/abdomen/pelvis negative for PE or aortic dissection.  Patient is placed on telemetry bed for observation.  ED physician discussed with Dr. Paticia Stack of cardiology.   Review of Systems:   General: no fevers, chills, no body weight gain, has fatigue HEENT: no blurry vision, hearing changes or sore throat Respiratory: has dyspnea, no coughing, wheezing CV: has chest pain, no palpitations GI: no nausea, vomiting, abdominal pain, diarrhea, constipation GU: no dysuria, burning on urination, increased urinary frequency, hematuria  Ext: no leg edema Neuro: no unilateral weakness, numbness, or tingling, no vision change or hearing  loss Skin: no rash, no skin tear. MSK: No muscle spasm, no deformity, no limitation of range of movement in spin Heme: No easy bruising.  Travel history: No recent long distant travel.  Allergy: No Known Allergies  Past Medical History:  Diagnosis Date   Hypertension     Past Surgical History:  Procedure Laterality Date   cyst removal in back      Social History:  reports that he has quit smoking. His smoking use included cigars. He has never used smokeless tobacco. He reports that he does not drink alcohol or use drugs.  Family History:  Family History  Problem Relation Age of Onset   Cancer Maternal Grandmother 61       multiple myeloma   Heart disease Paternal Uncle        40   Hypertension Mother    Stroke Mother    Drug abuse Neg Hx      Prior to Admission medications   Medication Sig Start Date End Date Taking? Authorizing Provider  amLODipine (NORVASC) 10 MG tablet TAKE 1 TABLET BY MOUTH ONCE DAILY Patient taking differently: Take 10 mg by mouth daily.  09/24/18  Yes Crecencio Mc, MD  aspirin 81 MG chewable tablet Chew 324 mg by mouth as needed (for sudden onset of chest pain).    Yes [provider]    Physical Exam: Vitals:   12/29/18 0330 12/29/18 0345 12/29/18 0400 12/29/18 0415  BP: (!) 133/92 (!) 136/95 (!) 137/93 (!) 143/91  Pulse: 90 94 83 89  Resp: 16 18 18  19  SpO2: 94% 97% 93% 95%  Weight:      Height:       General: Not in acute distress HEENT:       Eyes: PERRL, EOMI, no scleral icterus.       ENT: No discharge from the ears and nose, no pharynx injection, no tonsillar enlargement.        Neck: No JVD, no bruit, no mass felt. Heme: No neck lymph node enlargement. Cardiac: S1/S2, RRR, No murmurs, No gallops or rubs. Respiratory: No rales, wheezing, rhonchi or rubs. GI: Soft, nondistended, nontender, no rebound pain, no organomegaly, BS present. GU: No hematuria Ext: No pitting leg edema bilaterally. 2+DP/PT pulse  bilaterally. Musculoskeletal: No joint deformities, No joint redness or warmth, no limitation of ROM in spin. Skin: No rashes.  Neuro: Alert, oriented X3, cranial nerves II-XII grossly intact, moves all extremities normally. Psych: Patient is not psychotic, no suicidal or hemocidal ideation.  Labs on Admission: I have personally reviewed following labs and imaging studies  CBC: Recent Labs  Lab 12/28/18 2157  WBC 11.5*  NEUTROABS 5.3  HGB 15.6  HCT 46.1  MCV 87.5  PLT 960   Basic Metabolic Panel: Recent Labs  Lab 12/28/18 2157  NA 139  K 3.3*  CL 103  CO2 26  GLUCOSE 163*  BUN 11  CREATININE 1.39*  CALCIUM 9.4   GFR: Estimated Creatinine Clearance: 88.2 mL/min (A) (by C-G formula based on SCr of 1.39 mg/dL (H)). Liver Function Tests: Recent Labs  Lab 12/28/18 2157  AST 27  ALT 32  ALKPHOS 69  BILITOT 1.1  PROT 7.8  ALBUMIN 4.6   No results for input(s): LIPASE, AMYLASE in the last 168 hours. No results for input(s): AMMONIA in the last 168 hours. Coagulation Profile: No results for input(s): INR, PROTIME in the last 168 hours. Cardiac Enzymes: Recent Labs  Lab 12/28/18 2157 12/28/18 2348 12/29/18 0223  TROPONINI <0.03 <0.03 0.11*   BNP (last 3 results) No results for input(s): PROBNP in the last 8760 hours. HbA1C: No results for input(s): HGBA1C in the last 72 hours. CBG: No results for input(s): GLUCAP in the last 168 hours. Lipid Profile: No results for input(s): CHOL, HDL, LDLCALC, TRIG, CHOLHDL, LDLDIRECT in the last 72 hours. Thyroid Function Tests: No results for input(s): TSH, T4TOTAL, FREET4, T3FREE, THYROIDAB in the last 72 hours. Anemia Panel: No results for input(s): VITAMINB12, FOLATE, FERRITIN, TIBC, IRON, RETICCTPCT in the last 72 hours. Urine analysis:    Component Value Date/Time   COLORURINE YELLOW 09/18/2016 1000   APPEARANCEUR CLEAR 09/18/2016 1000   LABSPEC >=1.030 (A) 09/18/2016 1000   PHURINE 5.5 09/18/2016 1000    GLUCOSEU NEGATIVE 09/18/2016 1000   HGBUR TRACE-LYSED (A) 09/18/2016 1000   BILIRUBINUR NEGATIVE 09/18/2016 1000   KETONESUR NEGATIVE 09/18/2016 1000   UROBILINOGEN 0.2 09/18/2016 1000   NITRITE NEGATIVE 09/18/2016 1000   LEUKOCYTESUR NEGATIVE 09/18/2016 1000   Sepsis Labs: @LABRCNTIP (procalcitonin:4,lacticidven:4) )No results found for this or any previous visit (from the past 240 hour(s)).   Radiological Exams on Admission: Ct Angio Chest Pe W And/or Wo Contrast  Result Date: 12/28/2018 CLINICAL DATA:  48 year old male with concern for pulmonary embolism. EXAM: CT ANGIOGRAPHY CHEST WITH CONTRAST TECHNIQUE: Multidetector CT imaging of the chest was performed using the standard protocol during bolus administration of intravenous contrast. Multiplanar CT image reconstructions and MIPs were obtained to evaluate the vascular anatomy. CONTRAST:  175m ISOVUE-370 IOPAMIDOL (ISOVUE-370) INJECTION 76% COMPARISON:  None. FINDINGS: Cardiovascular: There  is no cardiomegaly or pericardial effusion. The thoracic aorta is unremarkable for the degree of opacification. There is no CT evidence of pulmonary embolism. Mediastinum/Nodes: No hilar or mediastinal adenopathy. The esophagus is grossly unremarkable. No mediastinal fluid collection. Lungs/Pleura: The lungs are clear. There is no pleural effusion or pneumothorax. The central airways are patent. Upper Abdomen: No acute abnormality. Musculoskeletal: No chest wall abnormality. No acute or significant osseous findings. Review of the MIP images confirms the above findings. IMPRESSION: No acute intrathoracic pathology. No CT evidence of pulmonary embolism. Electronically Signed   By: Anner Crete M.D.   On: 12/28/2018 23:25   Ct Angio Chest/abd/pel For Dissection W And/or Wo Contrast  Result Date: 12/29/2018 CLINICAL DATA:  Chest pain. Suspect abdominal aortic aneurysm. History of hypertension. EXAM: CT ANGIOGRAPHY CHEST, ABDOMEN AND PELVIS TECHNIQUE:  Multidetector CT imaging through the chest, abdomen and pelvis was performed using the standard protocol during bolus administration of intravenous contrast. Multiplanar reconstructed images and MIPs were obtained and reviewed to evaluate the vascular anatomy. CONTRAST:  130m OMNIPAQUE IOHEXOL 350 MG/ML SOLN COMPARISON:  CT angiogram chest December 28, 2018. FINDINGS: CTA CHEST FINDINGS CARDIOVASCULAR: Thoracic aorta is normal course and caliber. No intrinsic density on noncontrast CT. Homogeneous contrast opacification of thoracic aorta without dissection, aneurysm, luminal irregularity, periaortic fluid collections, or contrast extravasation. Heart size is normal. No pericardial effusion. No pulmonary embolism though not tailored for evaluation. MEDIASTINUM/NODES: No mediastinal mass or lymphadenopathy by CT size criteria. LUNGS/PLEURA: Tracheobronchial tree is patent, no pneumothorax. No pleural effusions, focal consolidations, pulmonary nodules or masses. MUSCULOSKELETAL: Non-suspicious. Review of the MIP images confirms the above findings. CTA ABDOMEN AND PELVIS FINDINGS VASCULAR Aorta: Abdominal aorta is normal course and caliber. Homogeneous contrast opacification of aortoiliac vessels without dissection, aneurysm, luminal irregularity, periaortic fluid collections, or contrast extravasation. Celiac: Patent. SMA: Patent. Renals: Patent. IMA: Patent. Inflow: Negative. Veins: Negative, not tailored for evaluation. Review of the MIP images confirms the above findings. NON-VASCULAR HEPATOBILIARY: Liver and gallbladder are normal. PANCREAS: Normal. SPLEEN: Normal. ADRENALS/URINARY TRACT: Kidneys are orthotopic, demonstrating symmetric enhancement. No obstructive uropathy or suspicious renal masses. Excretion of contrast and normal urinary collecting system from prior CTA chest limiting detection of small nonobstructing nephrolithiasis. Urinary bladder is partially distended and unremarkable. Normal adrenal glands.  STOMACH/BOWEL: The stomach, small and large bowel are normal in course and caliber without inflammatory changes. Normal appendix. VASCULAR/LYMPHATIC: No lymphadenopathy by CT size criteria. REPRODUCTIVE: Normal. OTHER: No intraperitoneal free fluid or free air. MUSCULOSKELETAL: Nonacute. Review of the MIP images confirms the above findings. IMPRESSION: CTA CHEST: 1. Normal CTA chest; no acute cardiopulmonary disease. CTA ABDOMEN AND PELVIS: 1. Normal CTA abdomen and pelvis; no acute intra-abdominal/pelvic disease. Electronically Signed   By: CElon AlasM.D.   On: 12/29/2018 03:03     EKG: Independently reviewed.  Sinus rhythm, QTC 383, LAE, J-point elevation in the inferior leads.  Assessment/Plan Principal Problem:   Chest pain Active Problems:   Hypertension   Hyperlipidemia   Elevated troponin   Hypokalemia   Acute renal failure superimposed on stage 2 chronic kidney disease (HCC)   Chest pain and elevated trop: CTA is negative for PE and aortic dissection.  First 2 sets of troponin were negative, the third troponin  was +0.11.  ED physician Dr. RWyvonnia Duskydiscussed with cardiologist, Dr. APaticia Stack "Discussed with cardiology Dr. APaticia Stackhe recommends medical admission and further work-up for non-ACS causes of troponin elevation and chest pain. Does not recommend heparin.  Agrees with trending troponins  and echocardiogram.  States cardiology can follow troponin trend".  - will place on Tele bed for obs - cycle CE q6 x3 and repeat EKG in the am  - prn Nitroglycerin, Morphine, and aspirin - start lipitor 40 mg daily - Risk factor stratification: will check FLP and A1C, UDS - 2d echo - Protonix and Mylanta prn - Card consult was requested via Epic  Hypertension: Bp 163/99, 152/97 -Continue amlodipine -IV hydralazine as needed  History of Hyperlipidemia: Patient has hyperlipidemia in his medical problem list, but he is not taking medications at home. -Start Lipitor 40 mg daily due to  positive troponin -Follow-up FLP  Hypokalemia: K=3.3 on admission. - Repleted - Check Mg level  Acute renal failure superimposed on stage 2 chronic kidney disease ([place]): Slightly worsening than baseline.  Recent cre was 1.19 on 12/15/2018.  His creatinine is 1.39, BUN 11.  -IV fluid: 500 cc normal saline bolus, followed by 125 cc/h   DVT ppx:  SQ Lovenox Code Status: Full code Family Communication: None at bed side.     Disposition Plan:  Anticipate discharge back to previous home environment Consults called:  EDP discussed with Dr. Paticia Stack of card Admission status: Obs / tele    Date of Service 12/29/2018    [facility]   If 7PM-7AM, please contact night-coverage [email or web address] Password [facility] 12/29/2018, 4:43 AM

## 2018-12-29 NOTE — Telephone Encounter (Signed)
FYI

## 2018-12-29 NOTE — Progress Notes (Signed)
The patient has been unavailable to be seen by this Probation officer as he has been in cath lab all day. The patient has undergone left heart cath with successful PCI to 100% occluded RCA with DES. Patient will be seen by the hospitalist service tomorrow.

## 2018-12-29 NOTE — ED Notes (Signed)
Attempted to call report; advised that charge nurse has not assigned room to nurse yet and someone will call me back.

## 2018-12-29 NOTE — Progress Notes (Signed)
ANTICOAGULATION CONSULT NOTE - Initial Consult  Pharmacy Consult for tirofiban Indication: chest pain/ACS  No Known Allergies  Patient Measurements: Height: 6\' 5"  (195.6 cm) Weight: 245 lb 6.4 oz (111.3 kg) IBW/kg (Calculated) : 89.1  Vital Signs: Temp: 98.3 F (36.8 C) (04/01 0805) Temp Source: Oral (04/01 0805) BP: 138/97 (04/01 1527) Pulse Rate: 70 (04/01 1527)  Labs: Recent Labs    12/28/18 2157  12/29/18 0223 12/29/18 0446 12/29/18 0922  HGB 15.6  --   --   --   --   HCT 46.1  --   --   --   --   PLT 191  --   --   --   --   CREATININE 1.39*  --   --   --   --   TROPONINI <0.03   < > 0.11* 0.48* 3.75*   < > = values in this interval not displayed.    Estimated Creatinine Clearance: 89.1 mL/min (A) (by C-G formula based on SCr of 1.39 mg/dL (H)).   Medical History: Past Medical History:  Diagnosis Date  . Hypertension     Medications:  Scheduled:  . aspirin  81 mg Oral Pre-Cath  . [MAR Hold] aspirin EC  325 mg Oral Daily  . [MAR Hold] atorvastatin  80 mg Oral q1800  . [MAR Hold] carvedilol  3.125 mg Oral BID WC  . [MAR Hold] enoxaparin (LOVENOX) injection  40 mg Subcutaneous Q24H  . [MAR Hold] pantoprazole  40 mg Oral Q1200  . sodium chloride flush  3 mL Intravenous Q12H    Assessment: 21 yom presenting with chest pain (trop 0.03>0.48>3.75). Underwent cardiac cath - results still pending.   Plan for tirofiban for 18 hours post-cath. Hgb 15.6, plt 191. Scr 1.39 (normCrCl 65 mL/min). No s/sx of bleeding.   Goal of Therapy:  Monitor platelets by anticoagulation protocol: Yes   Plan:  Start tirofiban (Aggrastat) at 0.15 mcg/kg/min for 18 hours Monitor CBC and for s/sx of bleeding  Antonietta Jewel, PharmD, BCCCP Clinical Pharmacist  Pager: [phone] Phone: [phone] 12/29/2018,4:57 PM

## 2018-12-29 NOTE — Consult Note (Signed)
[facility] Consult Note   Primary Physician: Deborra Medina Primary Cardiologist:  None  Reason for Consultation: Chest pain  HPI:    Dylan Haynes is 48 year old gentleman with a past medical history significant for obesity, hypertension, hyperlipidemia and chronic kidney disease who presents to the hospital with complaints of sharp 10 out of 10 central chest pain.  The patient states that earlier today while he was leaning forward in the bathroom he had the acute onset of sharp severe chest pain.  He had some associated diaphoresis and shortness of breath.  He denies any nausea or vomiting.  The pain persisted nearly constantly for at least 40 minutes.  An ECG performed by EMS suggested some ST segment changes and he was brought to the hospital.  The patient denies any fevers or chills or sick contacts.  There is no history of recent travel.  In the emergency department his blood pressure was slightly elevated.  A repeat EKG did not reveal any ST segment elevations.  A CT angiogram was negative for a pulmonary embolism.     Home Medications Prior to Admission medications   Medication Sig Start Date End Date Taking? Authorizing Provider  amLODipine (NORVASC) 10 MG tablet TAKE 1 TABLET BY MOUTH ONCE DAILY Patient taking differently: Take 10 mg by mouth daily.  09/24/18  Yes Crecencio Mc, MD  aspirin 81 MG chewable tablet Chew 324 mg by mouth as needed (for sudden onset of chest pain).    Yes [provider]    Past Medical History: Past Medical History:  Diagnosis Date   Hypertension     Past Surgical History: Past Surgical History:  Procedure Laterality Date   cyst removal in back      Family History: Family History  Problem Relation Age of Onset   Cancer Maternal Grandmother 57       multiple myeloma   Heart disease Paternal Uncle        41   Hypertension Mother    Stroke Mother    Drug abuse Neg Hx     Social History: Social History    Socioeconomic History   Marital status: Married    Spouse name: Not on file   Number of children: Not on file   Years of education: Not on file   Highest education level: Not on file  Occupational History   Occupation: Immunologist  Social Needs   Financial resource strain: Not on file   Food insecurity:    Worry: Not on file    Inability: Not on file   Transportation needs:    Medical: Not on file    Non-medical: Not on file  Tobacco Use   Smoking status: Former Smoker    Types: Cigars   Smokeless tobacco: Never Used  Substance and Sexual Activity   Alcohol use: No   Drug use: No   Sexual activity: Not on file  Lifestyle   Physical activity:    Days per week: Not on file    Minutes per session: Not on file   Stress: Not on file  Relationships   Social connections:    Talks on phone: Not on file    Gets together: Not on file    Attends religious service: Not on file    Active member of club or organization: Not on file    Attends meetings of clubs or organizations: Not on file    Relationship status: Not on file  Other Topics  Concern   Not on file  Social History Narrative   Not on file    Allergies:  No Known Allergies   Review of Systems: [y] = yes, _0  = no    General: Weight gain _1 ; Weight loss _2 ; Anorexia _3 ; Fatigue _4 ; Fever _5 ; Chills _6 ; Weakness _7    Cardiac: Chest pain/pressure [Y ]; Resting SOB [Y ]; Exertional SOB _8 ; Orthopnea _9 ; Pedal Edema _10 ; Palpitations _11 ; Syncope _12 ; Presyncope _13 ; Paroxysmal nocturnal dyspnea_14    Pulmonary: Cough _15 ; Wheezing_16 ; Hemoptysis_17 ; Sputum _18 ; Snoring _19    GI: Vomiting_20 ; Dysphagia_21 ; Melena_22 ; Hematochezia _23 ; Heartburn_24 ; Abdominal pain _25 ; Constipation _26 ; Diarrhea _27 ; BRBPR _28    GU: Hematuria_29 ; Dysuria _30 ; Nocturia_31    Vascular: Pain in legs with walking _32 ; Pain in feet with lying flat _33 ; Non-healing sores _34 ; Stroke _35 ; TIA _36 ; Slurred speech  _37 ;   Neuro: Headaches_38 ; Vertigo_39 ; Seizures_40 ; Paresthesias_41 ;Blurred vision _42 ; Diplopia _43 ; Vision changes _44    Ortho/Skin: Arthritis _45 ; Joint pain _46 ; Muscle pain _47 ; Joint swelling _48 ; Back Pain _49 ; Rash _50    Psych: Depression_51 ; Anxiety_52    Heme: Bleeding problems _53 ; Clotting disorders _54 ; Anemia _55    Endocrine: Diabetes _56 ; Thyroid dysfunction_57      Objective:    Vital Signs:   Temp:  [98.5 F (36.9 C)] 98.5 F (36.9 C) (04/01 0504) Pulse Rate:  [50-101] 89 (04/01 0504) Resp:  [11-20] 19 (04/01 0415) BP: (133-163)/(79-103) 141/89 (04/01 0504) SpO2:  [93 %-100 %] 97 % (04/01 0504) Weight:  [108.9 kg-111.3 kg] 111.3 kg (04/01 0518) Last BM Date: 12/28/18  Weight change: Filed Weights   12/28/18 2156 12/29/18 0504 12/29/18 0518  Weight: 108.9 kg 111.3 kg 111.3 kg    Intake/Output:   Intake/Output Summary (Last 24 hours) at 12/29/2018 0522 Last data filed at 12/29/2018 0511 Gross per 24 hour  Intake --  Output 200 ml  Net -200 ml      Physical Exam    General:  Well appearing. No resp difficulty HEENT: normal Neck: supple. JVP . Carotids 2+ bilat; no bruits. No lymphadenopathy or thyromegaly appreciated. Cor: PMI nondisplaced. Regular rate & rhythm. No rubs, gallops or murmurs. Lungs: clear Abdomen: soft, nontender, nondistended. No hepatosplenomegaly. No bruits or masses. Good bowel sounds. Extremities: no cyanosis, clubbing, rash, edema Neuro: alert & orientedx3, cranial nerves grossly intact. moves all 4 extremities w/o difficulty. Affect pleasant    EKG    The ECG revealed slight J-point elevations in the inferior leads with PR segment depressions.  Labs   Basic Metabolic Panel: Recent Labs  Lab 12/28/18 2157  NA 139  K 3.3*  CL 103  CO2 26  GLUCOSE 163*  BUN 11  CREATININE 1.39*  CALCIUM 9.4    Liver Function Tests: Recent Labs  Lab 12/28/18 2157  AST 27  ALT 32  ALKPHOS 69  BILITOT 1.1  PROT 7.8  ALBUMIN  4.6   No results for input(s): LIPASE, AMYLASE in the last 168 hours. No results for input(s): AMMONIA in the last 168 hours.  CBC: Recent Labs  Lab 12/28/18 2157  WBC 11.5*  NEUTROABS 5.3  HGB 15.6  HCT 46.1  MCV 87.5  PLT 191  Cardiac Enzymes: Recent Labs  Lab 12/28/18 2157 12/28/18 2348 12/29/18 0223  TROPONINI <0.03 <0.03 0.11*    BNP: BNP (last 3 results) No results for input(s): BNP in the last 8760 hours.  ProBNP (last 3 results) No results for input(s): PROBNP in the last 8760 hours.   CBG: No results for input(s): GLUCAP in the last 168 hours.  Coagulation Studies: No results for input(s): LABPROT, INR in the last 72 hours.   Imaging   Ct Angio Chest Pe W And/or Wo Contrast  Result Date: 12/28/2018 CLINICAL DATA:  48 year old male with concern for pulmonary embolism. EXAM: CT ANGIOGRAPHY CHEST WITH CONTRAST TECHNIQUE: Multidetector CT imaging of the chest was performed using the standard protocol during bolus administration of intravenous contrast. Multiplanar CT image reconstructions and MIPs were obtained to evaluate the vascular anatomy. CONTRAST:  165m ISOVUE-370 IOPAMIDOL (ISOVUE-370) INJECTION 76% COMPARISON:  None. FINDINGS: Cardiovascular: There is no cardiomegaly or pericardial effusion. The thoracic aorta is unremarkable for the degree of opacification. There is no CT evidence of pulmonary embolism. Mediastinum/Nodes: No hilar or mediastinal adenopathy. The esophagus is grossly unremarkable. No mediastinal fluid collection. Lungs/Pleura: The lungs are clear. There is no pleural effusion or pneumothorax. The central airways are patent. Upper Abdomen: No acute abnormality. Musculoskeletal: No chest wall abnormality. No acute or significant osseous findings. Review of the MIP images confirms the above findings. IMPRESSION: No acute intrathoracic pathology. No CT evidence of pulmonary embolism. Electronically Signed   By: AAnner CreteM.D.   On:  12/28/2018 23:25   Ct Angio Chest/abd/pel For Dissection W And/or Wo Contrast  Result Date: 12/29/2018 CLINICAL DATA:  Chest pain. Suspect abdominal aortic aneurysm. History of hypertension. EXAM: CT ANGIOGRAPHY CHEST, ABDOMEN AND PELVIS TECHNIQUE: Multidetector CT imaging through the chest, abdomen and pelvis was performed using the standard protocol during bolus administration of intravenous contrast. Multiplanar reconstructed images and MIPs were obtained and reviewed to evaluate the vascular anatomy. CONTRAST:  105mOMNIPAQUE IOHEXOL 350 MG/ML SOLN COMPARISON:  CT angiogram chest December 28, 2018. FINDINGS: CTA CHEST FINDINGS CARDIOVASCULAR: Thoracic aorta is normal course and caliber. No intrinsic density on noncontrast CT. Homogeneous contrast opacification of thoracic aorta without dissection, aneurysm, luminal irregularity, periaortic fluid collections, or contrast extravasation. Heart size is normal. No pericardial effusion. No pulmonary embolism though not tailored for evaluation. MEDIASTINUM/NODES: No mediastinal mass or lymphadenopathy by CT size criteria. LUNGS/PLEURA: Tracheobronchial tree is patent, no pneumothorax. No pleural effusions, focal consolidations, pulmonary nodules or masses. MUSCULOSKELETAL: Non-suspicious. Review of the MIP images confirms the above findings. CTA ABDOMEN AND PELVIS FINDINGS VASCULAR Aorta: Abdominal aorta is normal course and caliber. Homogeneous contrast opacification of aortoiliac vessels without dissection, aneurysm, luminal irregularity, periaortic fluid collections, or contrast extravasation. Celiac: Patent. SMA: Patent. Renals: Patent. IMA: Patent. Inflow: Negative. Veins: Negative, not tailored for evaluation. Review of the MIP images confirms the above findings. NON-VASCULAR HEPATOBILIARY: Liver and gallbladder are normal. PANCREAS: Normal. SPLEEN: Normal. ADRENALS/URINARY TRACT: Kidneys are orthotopic, demonstrating symmetric enhancement. No obstructive  uropathy or suspicious renal masses. Excretion of contrast and normal urinary collecting system from prior CTA chest limiting detection of small nonobstructing nephrolithiasis. Urinary bladder is partially distended and unremarkable. Normal adrenal glands. STOMACH/BOWEL: The stomach, small and large bowel are normal in course and caliber without inflammatory changes. Normal appendix. VASCULAR/LYMPHATIC: No lymphadenopathy by CT size criteria. REPRODUCTIVE: Normal. OTHER: No intraperitoneal free fluid or free air. MUSCULOSKELETAL: Nonacute. Review of the MIP images confirms the above findings. IMPRESSION: CTA CHEST: 1. Normal CTA chest;  no acute cardiopulmonary disease. CTA ABDOMEN AND PELVIS: 1. Normal CTA abdomen and pelvis; no acute intra-abdominal/pelvic disease. Electronically Signed   By: Elon Alas M.D.   On: 12/29/2018 03:03      Medications:     Current Medications:  aspirin EC  325 mg Oral Daily   atorvastatin  40 mg Oral q1800   enoxaparin (LOVENOX) injection  40 mg Subcutaneous Q24H   pantoprazole  40 mg Oral Q1200     Infusions:  sodium chloride 125 mL/hr at 12/29/18 0511       Assessment/Plan   1. Chest pain episodes  The patient has risk factors for coronary artery disease such as hypertension and hyperlipidemia.  He presents with complaints of severe substernal chest pain.  His EKG initially had some mild J-point elevations in the inferior leads.  Subsequent ECGs in the hospital were normal.  His troponin trend was 0.03, 0.03 and 0.11.  WBC is elevated at 11.5.   Likely differential for the patient includes ACS versus inflammatory process (pericarditis?)  -Continue to get serial troponins to determine trend over the next 24 hours -Transthoracic echocardiogram to evaluate LV systolic function -Check lipid panel, hemoglobin A1c -Aspirin 81 mg daily -Blood pressure control -High-dose statins (atorvastatin 40 to 80 mg daily) -Admit to  telemetry     Meade Maw, MD  12/29/2018, 5:22 AM  Cardiology Overnight Team Please contact [facility] for night-coverage after hours (4p -7a ) and weekends on [email or web address]

## 2018-12-29 NOTE — Interval H&P Note (Signed)
Cath Lab Visit (complete for each Cath Lab visit)  Clinical Evaluation Leading to the Procedure:   ACS: Yes.    Non-ACS:    Anginal Classification: CCS IV  Anti-ischemic medical therapy: Maximal Therapy (2 or more classes of medications)  Non-Invasive Test Results: No non-invasive testing performed  Prior CABG: No previous CABG      History and Physical Interval Note:  12/29/2018 1:09 PM  Dylan Haynes  has presented today for surgery, with the diagnosis of non stemi.  The various methods of treatment have been discussed with the patient and family. After consideration of risks, benefits and other options for treatment, the patient has consented to  Procedure(s): LEFT HEART CATH AND CORONARY ANGIOGRAPHY (N/A) as a surgical intervention.  The patient's history has been reviewed, patient examined, no change in status, stable for surgery.  I have reviewed the patient's chart and labs.  Questions were answered to the patient's satisfaction.     Shelva Majestic

## 2018-12-29 NOTE — Telephone Encounter (Signed)
More than likely it will be a TCM, but we will have to wait until discharge to be sure. I will follow as appropriate.

## 2018-12-29 NOTE — Progress Notes (Signed)
Patient called RN complaining of nausea.  Zofran administered per PRN order at about [phone].  RN reviewed labs and noted last Troponin result was 0.48.  RN asked patient if he was having chest pain and patient rated pain 4/10.  Oxygen at 4L via nasal cannula applied.  EKG obtained.  Sublingual nitroglycerin administered.  After one sublingual nitroglycerin patient rated pain 3/10.  RN administered second sublingual nitroglycerin.  RN text paged Cardiology.  Reino Bellis, NP returned paged and was updated; stated to keep patient NPO.  Day shift RN at bedside and bedside reporting completed.

## 2018-12-29 NOTE — Plan of Care (Signed)

## 2018-12-29 NOTE — Progress Notes (Signed)
Progress Note  Patient Name: Dylan Haynes Date of Encounter: 12/30/2018  Primary Cardiologist: New to [facility] - no angina or dyspnea. No pain or bleeding at radial access site.  Inpatient Medications    Scheduled Meds:  aspirin  81 mg Oral Daily   atorvastatin  80 mg Oral q1800   carvedilol  6.25 mg Oral BID WC   enoxaparin (LOVENOX) injection  40 mg Subcutaneous Q24H   pantoprazole  40 mg Oral Q1200   potassium chloride  20 mEq Oral Once   sodium chloride flush  3 mL Intravenous Q12H   ticagrelor  90 mg Oral BID   Continuous Infusions:  sodium chloride Stopped (12/30/18 0514)   sodium chloride Stopped (12/29/18 2125)   sodium chloride     tirofiban 0.15 mcg/kg/min (12/30/18 0622)   PRN Meds: sodium chloride, acetaminophen, alum & mag hydroxide-simeth, diazepam, hydrALAZINE, morphine injection, nitroGLYCERIN, ondansetron (ZOFRAN) IV, sodium chloride flush, zolpidem   Vital Signs    Vitals:   12/29/18 2359 12/30/18 0338 12/30/18 0717 12/30/18 0800  BP: (!) 144/90 136/88 135/89 139/81  Pulse: 90 86 76 85  Resp: (!) 22 (!) 24 20 16   Temp: 98.3 F (36.8 C) 98.1 F (36.7 C) 98 F (36.7 C)   TempSrc: Oral Oral Oral   SpO2: 100% 100% 97% 98%  Weight:      Height:        Intake/Output Summary (Last 24 hours) at 12/30/2018 0911 Last data filed at 12/30/2018 0800 Gross per 24 hour  Intake 3560.24 ml  Output 1850 ml  Net 1710.24 ml   Filed Weights   12/28/18 2156 12/29/18 0504 12/29/18 0518  Weight: 108.9 kg 111.3 kg 111.3 kg    Physical Exam   Right radial access site without hematoma or bleeding. General: Well developed, well nourished, NAD Skin: Warm, dry, intact  Head: Normocephalic, atraumatic, clear, moist mucus membranes. Neck: Negative for carotid bruits. No JVD Lungs:Clear to ausculation bilaterally. No wheezes, rales, or rhonchi. Breathing is unlabored. Cardiovascular: RRR with S1 S2. No murmurs, rubs,  gallops, or LV heave appreciated. Abdomen: Soft, non-tender, non-distended with normoactive bowel sounds. No obvious abdominal masses. MSK: Strength and tone appear normal for age. 5/5 in all extremities Extremities: No edema. No clubbing or cyanosis. DP/PT pulses 2+ bilaterally Neuro: Alert and oriented. No focal deficits. No facial asymmetry. MAE spontaneously. Psych: Responds to questions appropriately with normal affect.    Labs    Chemistry Recent Labs  Lab 12/28/18 2157 12/30/18 0238  NA 139 137  K 3.3* 3.4*  CL 103 109  CO2 26 23  GLUCOSE 163* 122*  BUN 11 8  CREATININE 1.39* 1.09  CALCIUM 9.4 8.2*  PROT 7.8  --   ALBUMIN 4.6  --   AST 27  --   ALT 32  --   ALKPHOS 69  --   BILITOT 1.1  --   GFRNONAA 59* >60  GFRAA >60 >60  ANIONGAP 10 5     Hematology Recent Labs  Lab 12/28/18 2157 12/29/18 2239 12/30/18 0238  WBC 11.5* 14.9* 12.5*  RBC 5.27 4.91 4.51  HGB 15.6 14.7 13.2  HCT 46.1 42.9 39.2  MCV 87.5 87.4 86.9  MCH 29.6 29.9 29.3  MCHC 33.8 34.3 33.7  RDW 12.5 13.0 12.9  PLT 191 176 166    Cardiac Enzymes Recent Labs  Lab 12/29/18 0223 12/29/18 0446 12/29/18 0922 12/29/18 1736  TROPONINI 0.11* 0.48* 3.75*  31.13*   No results for input(s): TROPIPOC in the last 168 hours.   BNPNo results for input(s): BNP, PROBNP in the last 168 hours.   DDimer No results for input(s): DDIMER in the last 168 hours.   Radiology    Ct Angio Chest Pe W And/or Wo Contrast  Result Date: 12/28/2018 CLINICAL DATA:  48 year old male with concern for pulmonary embolism. EXAM: CT ANGIOGRAPHY CHEST WITH CONTRAST TECHNIQUE: Multidetector CT imaging of the chest was performed using the standard protocol during bolus administration of intravenous contrast. Multiplanar CT image reconstructions and MIPs were obtained to evaluate the vascular anatomy. CONTRAST:  144mL ISOVUE-370 IOPAMIDOL (ISOVUE-370) INJECTION 76% COMPARISON:  None. FINDINGS: Cardiovascular: There is no  cardiomegaly or pericardial effusion. The thoracic aorta is unremarkable for the degree of opacification. There is no CT evidence of pulmonary embolism. Mediastinum/Nodes: No hilar or mediastinal adenopathy. The esophagus is grossly unremarkable. No mediastinal fluid collection. Lungs/Pleura: The lungs are clear. There is no pleural effusion or pneumothorax. The central airways are patent. Upper Abdomen: No acute abnormality. Musculoskeletal: No chest wall abnormality. No acute or significant osseous findings. Review of the MIP images confirms the above findings. IMPRESSION: No acute intrathoracic pathology. No CT evidence of pulmonary embolism. Electronically Signed   By: Anner Crete M.D.   On: 12/28/2018 23:25   Ct Angio Chest/abd/pel For Dissection W And/or Wo Contrast  Result Date: 12/29/2018 CLINICAL DATA:  Chest pain. Suspect abdominal aortic aneurysm. History of hypertension. EXAM: CT ANGIOGRAPHY CHEST, ABDOMEN AND PELVIS TECHNIQUE: Multidetector CT imaging through the chest, abdomen and pelvis was performed using the standard protocol during bolus administration of intravenous contrast. Multiplanar reconstructed images and MIPs were obtained and reviewed to evaluate the vascular anatomy. CONTRAST:  141mL OMNIPAQUE IOHEXOL 350 MG/ML SOLN COMPARISON:  CT angiogram chest December 28, 2018. FINDINGS: CTA CHEST FINDINGS CARDIOVASCULAR: Thoracic aorta is normal course and caliber. No intrinsic density on noncontrast CT. Homogeneous contrast opacification of thoracic aorta without dissection, aneurysm, luminal irregularity, periaortic fluid collections, or contrast extravasation. Heart size is normal. No pericardial effusion. No pulmonary embolism though not tailored for evaluation. MEDIASTINUM/NODES: No mediastinal mass or lymphadenopathy by CT size criteria. LUNGS/PLEURA: Tracheobronchial tree is patent, no pneumothorax. No pleural effusions, focal consolidations, pulmonary nodules or masses.  MUSCULOSKELETAL: Non-suspicious. Review of the MIP images confirms the above findings. CTA ABDOMEN AND PELVIS FINDINGS VASCULAR Aorta: Abdominal aorta is normal course and caliber. Homogeneous contrast opacification of aortoiliac vessels without dissection, aneurysm, luminal irregularity, periaortic fluid collections, or contrast extravasation. Celiac: Patent. SMA: Patent. Renals: Patent. IMA: Patent. Inflow: Negative. Veins: Negative, not tailored for evaluation. Review of the MIP images confirms the above findings. NON-VASCULAR HEPATOBILIARY: Liver and gallbladder are normal. PANCREAS: Normal. SPLEEN: Normal. ADRENALS/URINARY TRACT: Kidneys are orthotopic, demonstrating symmetric enhancement. No obstructive uropathy or suspicious renal masses. Excretion of contrast and normal urinary collecting system from prior CTA chest limiting detection of small nonobstructing nephrolithiasis. Urinary bladder is partially distended and unremarkable. Normal adrenal glands. STOMACH/BOWEL: The stomach, small and large bowel are normal in course and caliber without inflammatory changes. Normal appendix. VASCULAR/LYMPHATIC: No lymphadenopathy by CT size criteria. REPRODUCTIVE: Normal. OTHER: No intraperitoneal free fluid or free air. MUSCULOSKELETAL: Nonacute. Review of the MIP images confirms the above findings. IMPRESSION: CTA CHEST: 1. Normal CTA chest; no acute cardiopulmonary disease. CTA ABDOMEN AND PELVIS: 1. Normal CTA abdomen and pelvis; no acute intra-abdominal/pelvic disease. Electronically Signed   By: Elon Alas M.D.   On: 12/29/2018 03:03   Telemetry  NSR - Personally Reviewed  ECG    12/29/2018 NSR - Personally Reviewed  Cardiac Studies   Echocardiogram 12/29/2018: Pending results   Patient Profile     48 y.o. male with a past medical history significant for obesity, hypertension, hyperlipidemia and chronic kidney disease who presented to [facility] on 12/28/2018 with complaints of sharp 10 out of  10 central chest pain, seen by cardiology.   Assessment & Plan    1. NSTEMI: - s/p PCI-DES RCA. Troponin peaked at >30, but on today's echo the LVEF is preserved and there is only mild hypokinesis (my review at bedside). - ASA 81 mg + Brilinta 90 mg BID for 12 months, high dose statin - DC today, will try to give rehab instructions that he can do at home, since rehab closed.   2. HTN: - replace amlodipine with carvedilol. Dose increased today  3. HLD: - LDL found to be significantly elevated at 181 - Atorvastatin to 80mg  daily  - Will need recheck in 3 months with LFT's   4. Elevated creatinine: - improved after hydration, normal renal function.  Signed, Kathyrn Drown NP-[facility] Pager: [phone] 12/30/2018, 9:11 AM     For questions or updates, please contact   Please consult [email or web address] for contact info under Cardiology/STEMI.

## 2018-12-29 NOTE — Telephone Encounter (Signed)
Will this require a TCM?

## 2018-12-29 NOTE — ED Provider Notes (Signed)
Care assumed from Dr. Lita Mains.  Patient presents with acute onset of left-sided chest pain.  Code STEMI was canceled by cardiology.  EKG here is normal.  Patient is pain is improving.  Second troponin is pending.  CT scan is negative for pulmonary embolism.  Equal upper extremity blood pressures.  Serial troponins are negative.  Patient hypertensive with history of the same.  Discussed with radiology Dr. Quintella Reichert who states there is no evidence of thoracic aortic dissection seen on the CT scan though it is not gated for such.  Repeat CT does not show any evidence of aortic dissection.  Patient's chest pain is still persistent though improving after morphine, nitroglycerin and GI cocktail.  2 troponin is negative though third is elevated at 0.11.  Discussed with cardiology Dr. Paticia Stack he recommends medical admission and further work-up for non-ACS causes of troponin elevation and chest pain. Does not recommend heparin.  Agrees with trending troponins and echocardiogram.  States cardiology can follow troponin trend.  Admission discussed with Dr. Blaine Hamper.  CRITICAL CARE Performed by: Ezequiel Essex Total critical care time: 32 minutes Critical care time was exclusive of separately billable procedures and treating other patients. Critical care was necessary to treat or prevent imminent or life-threatening deterioration. Critical care was time spent personally by me on the following activities: development of treatment plan with patient and/or surrogate as well as nursing, discussions with consultants, evaluation of patient's response to treatment, examination of patient, obtaining history from patient or surrogate, ordering and performing treatments and interventions, ordering and review of laboratory studies, ordering and review of radiographic studies, pulse oximetry and re-evaluation of patient's condition.    Ezequiel Essex, MD 12/29/18 [phone]

## 2018-12-30 ENCOUNTER — Encounter (HOSPITAL_COMMUNITY): Payer: Self-pay | Admitting: Cardiovascular Disease

## 2018-12-30 ENCOUNTER — Inpatient Hospital Stay (HOSPITAL_COMMUNITY): Payer: 59

## 2018-12-30 ENCOUNTER — Telehealth: Payer: Self-pay | Admitting: Physician Assistant

## 2018-12-30 DIAGNOSIS — I10 Essential (primary) hypertension: Secondary | ICD-10-CM

## 2018-12-30 DIAGNOSIS — E7800 Pure hypercholesterolemia, unspecified: Secondary | ICD-10-CM

## 2018-12-30 DIAGNOSIS — I214 Non-ST elevation (NSTEMI) myocardial infarction: Principal | ICD-10-CM

## 2018-12-30 DIAGNOSIS — R079 Chest pain, unspecified: Secondary | ICD-10-CM

## 2018-12-30 DIAGNOSIS — N179 Acute kidney failure, unspecified: Secondary | ICD-10-CM

## 2018-12-30 LAB — BASIC METABOLIC PANEL
Anion gap: 5 (ref 5–15)
BUN: 8 mg/dL (ref 6–20)
CO2: 23 mmol/L (ref 22–32)
Calcium: 8.2 mg/dL — ABNORMAL LOW (ref 8.9–10.3)
Chloride: 109 mmol/L (ref 98–111)
Creatinine, Ser: 1.09 mg/dL (ref 0.61–1.24)
GFR calc Af Amer: >60 mL/min (ref >60)
GFR calc non Af Amer: >60 mL/min (ref >60)
Glucose, Bld: 122 mg/dL — ABNORMAL HIGH (ref 70–99)
Potassium: 3.4 mmol/L — ABNORMAL LOW (ref 3.5–5.1)
Sodium: 137 mmol/L (ref 135–145)

## 2018-12-30 LAB — CBC
HCT: 39.2 % (ref 39.0–52.0)
Hemoglobin: 13.2 g/dL (ref 13.0–17.0)
MCH: 29.3 pg (ref 26.0–34.0)
MCHC: 33.7 g/dL (ref 30.0–36.0)
MCV: 86.9 fL (ref 80.0–100.0)
Platelets: 166 10*3/uL (ref 150–400)
RBC: 4.51 MIL/uL (ref 4.22–5.81)
RDW: 12.9 % (ref 11.5–15.5)
WBC: 12.5 10*3/uL — ABNORMAL HIGH (ref 4.0–10.5)
nRBC: 0 % (ref 0.0–0.2)

## 2018-12-30 LAB — ECHOCARDIOGRAM COMPLETE
Height: 77 in
Weight: 3926.4 oz

## 2018-12-30 MED ORDER — TICAGRELOR 90 MG PO TABS: 90.0000 mg | ORAL_TABLET | Freq: Two times a day (BID) | ORAL | 0 refills | Status: DC

## 2018-12-30 MED ORDER — ATORVASTATIN CALCIUM 80 MG PO TABS: 80.0000 mg | ORAL_TABLET | Freq: Every day | ORAL | 0 refills | Status: DC

## 2018-12-30 MED ORDER — NITROGLYCERIN 0.4 MG SL SUBL: 0.4000 mg | SUBLINGUAL_TABLET | SUBLINGUAL | 0 refills | Status: DC | PRN

## 2018-12-30 MED ORDER — CARVEDILOL 6.25 MG PO TABS
6.2500 mg | ORAL_TABLET | Freq: Two times a day (BID) | ORAL | Status: DC
  Administered 2018-12-30: 6.25 mg via ORAL
  Filled 2018-12-30: qty 1

## 2018-12-30 MED ORDER — CARVEDILOL 6.25 MG PO TABS: 6.2500 mg | ORAL_TABLET | Freq: Two times a day (BID) | ORAL | 0 refills | Status: DC

## 2018-12-30 MED ORDER — ASPIRIN 81 MG PO CHEW: 81.0000 mg | CHEWABLE_TABLET | Freq: Every day | ORAL | 0 refills | Status: DC

## 2018-12-30 MED ORDER — POTASSIUM CHLORIDE CRYS ER 20 MEQ PO TBCR
20.0000 meq | EXTENDED_RELEASE_TABLET | Freq: Once | ORAL | Status: AC
  Administered 2018-12-30: 09:00:00 20 meq via ORAL
  Filled 2018-12-30: qty 1

## 2018-12-30 MED FILL — Lidocaine HCl Local Preservative Free (PF) Inj 1%: INTRAMUSCULAR | Qty: 30 | Status: AC

## 2018-12-30 MED FILL — Verapamil HCl IV Soln 2.5 MG/ML: INTRAVENOUS | Qty: 2 | Status: AC

## 2018-12-30 NOTE — Discharge Summary (Addendum)
Discharge Summary  Dylan Haynes YWV:[id number] DOB: 27-May-1971  PCP: Crecencio Mc, MD  Admit date: 12/28/2018 Discharge date: 12/30/2018  Time spent: 67mins.  Recommendations for Outpatient Follow-up:  1. F/u with PCP within a week  for hospital discharge follow up, repeat cbc/bmp at follow up 2. F/u with cardiology on 4/20  Discharge Diagnoses:  Active Hospital Problems   Diagnosis Date Noted   Chest pain 12/29/2018   Elevated troponin 12/29/2018   Hypokalemia 12/29/2018   Acute renal failure superimposed on stage 2 chronic kidney disease ([facility]) 12/29/2018   NSTEMI (non-ST elevated myocardial infarction) ([facility]) 12/29/2018   Acute coronary syndrome ([facility])    Hyperlipidemia 12/30/2012   Hypertension     Resolved Hospital Problems  No resolved problems to display.    Discharge Condition: stable  Diet recommendation: heart healthy  Filed Weights   12/28/18 2156 12/29/18 0504 12/29/18 0518  Weight: 108.9 kg 111.3 kg 111.3 kg    History of present illness: (per admitting MD Dr Blaine Hamper) PCP: Crecencio Mc, MD   Patient coming from:  The patient is coming from home.  At baseline, pt is independent for most of ADL.        Chief Complaint: chest pain  HPI: Dylan Haynes is a 48 y.o. male with medical history significant of hypertension, hyperlipidemia, obesity, CKD-2, who presents with chest pain.  Patient states that his chest pain started about 6 PM.  It is located in the central chest, sharp, 10 out of 10 in severity, radiating to the left arm.  It is associated with mild shortness of breath and diaphoresis.  Patient does not have cough, fever or chills.  No long distance traveling recently.  No tenderness in the calf areas.  Patient denies nausea vomiting, diarrhea, abdominal pain, symptoms of UTI or unilateral weakness.  Per EMS patient was diaphoretic and EKG demonstrated possible ST segment elevation in the inferior leads. Code STEMI was called, which  was canceled by cardiology after reviewed EKG.    ED Course: pt was found to have troponin 0.03, 0.03, 0.11, WBC 11.5, potassium 3.3, worsening renal function, heart rate 50-101, oxygen saturation 95% on room air, blood pressure 163/99.  CT angiogram of chest and CT angiogram of chest/abdomen/pelvis negative for PE or aortic dissection.  Patient is placed on telemetry bed for observation.  ED physician discussed with Dr. Paticia Stack of cardiology.  Hospital Course:  Principal Problem:   Chest pain Active Problems:   Hypertension   Hyperlipidemia   Elevated troponin   Hypokalemia   Acute renal failure superimposed on stage 2 chronic kidney disease (HCC)   NSTEMI (non-ST elevated myocardial infarction) ([facility])   Acute coronary syndrome ([facility])  NSTEMI: -troponin peaked at 31, S/p urgent cardiac cath with Successful PCI to the totally occluded RCA with insertion of a 2.5 x 12 mm Resolute DES stent postdilated to 3.7 mm with 100% occlusion being reduced to 0%. -Patient improved quicker than expected, -He is chest pain free, hemodynamically stable at discharge -He is cleared to discharge home by cardiology, discharge meds per cardiology" on coreg, statin, asa/brinllinta, stop norvasc -He is to follow up with cardiology  HTN/HLD: on coreg, statin. Stop norvasc  Hypokalemia: replace k  AKI; cr on presentation was 1.39, cr at discharge 1.09  Leukocytosis: no fever, does not appear septic, no source of infection, leukocytosis likely due to stress, improved at discharge     Procedures:  Cardiac cath with stent placement  Consultations:  cardiology  Discharge Exam: BP 139/81    Pulse 85    Temp 98 F (36.7 C) (Oral)    Resp 16    Ht 6\' 5"  (1.956 m)    Wt 111.3 kg    SpO2 98%    BMI 29.10 kg/m   General: NAD Cardiovascular: RRR Respiratory: CTABL  Discharge Instructions You were cared for by a hospitalist during your hospital stay. If you have any questions about your discharge  medications or the care you received while you were in the hospital after you are discharged, you can call the unit and asked to speak with the hospitalist on call if the hospitalist that took care of you is not available. Once you are discharged, your primary care physician will handle any further medical issues. Please note that NO REFILLS for any discharge medications will be authorized once you are discharged, as it is imperative that you return to your primary care physician (or establish a relationship with a primary care physician if you do not have one) for your aftercare needs so that they can reassess your need for medications and monitor your lab values.  Discharge Instructions    Diet - low sodium heart healthy   Complete by:  As directed    Discharge instructions   Complete by:  As directed    Please check your blood pressure at home, call cardiology 's office for questions regarding your blood pressure. Please bring in blood pressure records for your cardiology to review at follow up appointment.   Increase activity slowly   Complete by:  As directed      Allergies as of 12/30/2018   No Known Allergies     Medication List    STOP taking these medications   amLODipine 10 MG tablet Commonly known as:  NORVASC     TAKE these medications   aspirin 81 MG chewable tablet Chew 1 tablet (81 mg total) by mouth daily. Start taking on:  December 31, 2018 What changed:    how much to take  when to take this  reasons to take this   atorvastatin 80 MG tablet Commonly known as:  LIPITOR Take 1 tablet (80 mg total) by mouth daily at 6 PM.   carvedilol 6.25 MG tablet Commonly known as:  COREG Take 1 tablet (6.25 mg total) by mouth 2 (two) times daily with a meal.   nitroGLYCERIN 0.4 MG SL tablet Commonly known as:  NITROSTAT Place 1 tablet (0.4 mg total) under the tongue every 5 (five) minutes as needed for chest pain.   ticagrelor 90 MG Tabs tablet Commonly known as:   BRILINTA Take 1 tablet (90 mg total) by mouth 2 (two) times daily. Please contact cardiology Dr Criotoru's office for prior authorization or refills.      No Known Allergies Follow-up Information    Crecencio Mc, MD Follow up.   Specialty:  Internal Medicine Why:  hospital discharge follow up Contact information: [address] [place] [address] [phone]        Croitoru, Dani Gobble, MD Follow up.   Specialty:  Cardiology Contact information: [address] [place] [address] [phone]            The results of significant diagnostics from this hospitalization (including imaging, microbiology, ancillary and laboratory) are listed below for reference.    Significant Diagnostic Studies: Ct Angio Chest Pe W And/or Wo Contrast  Result Date: 12/28/2018 CLINICAL DATA:  48 year old male with concern  for pulmonary embolism. EXAM: CT ANGIOGRAPHY CHEST WITH CONTRAST TECHNIQUE: Multidetector CT imaging of the chest was performed using the standard protocol during bolus administration of intravenous contrast. Multiplanar CT image reconstructions and MIPs were obtained to evaluate the vascular anatomy. CONTRAST:  177mL ISOVUE-370 IOPAMIDOL (ISOVUE-370) INJECTION 76% COMPARISON:  None. FINDINGS: Cardiovascular: There is no cardiomegaly or pericardial effusion. The thoracic aorta is unremarkable for the degree of opacification. There is no CT evidence of pulmonary embolism. Mediastinum/Nodes: No hilar or mediastinal adenopathy. The esophagus is grossly unremarkable. No mediastinal fluid collection. Lungs/Pleura: The lungs are clear. There is no pleural effusion or pneumothorax. The central airways are patent. Upper Abdomen: No acute abnormality. Musculoskeletal: No chest wall abnormality. No acute or significant osseous findings. Review of the MIP images confirms the above findings. IMPRESSION: No acute intrathoracic pathology. No CT evidence of pulmonary  embolism. Electronically Signed   By: Anner Crete M.D.   On: 12/28/2018 23:25   Ct Angio Chest/abd/pel For Dissection W And/or Wo Contrast  Result Date: 12/29/2018 CLINICAL DATA:  Chest pain. Suspect abdominal aortic aneurysm. History of hypertension. EXAM: CT ANGIOGRAPHY CHEST, ABDOMEN AND PELVIS TECHNIQUE: Multidetector CT imaging through the chest, abdomen and pelvis was performed using the standard protocol during bolus administration of intravenous contrast. Multiplanar reconstructed images and MIPs were obtained and reviewed to evaluate the vascular anatomy. CONTRAST:  169mL OMNIPAQUE IOHEXOL 350 MG/ML SOLN COMPARISON:  CT angiogram chest December 28, 2018. FINDINGS: CTA CHEST FINDINGS CARDIOVASCULAR: Thoracic aorta is normal course and caliber. No intrinsic density on noncontrast CT. Homogeneous contrast opacification of thoracic aorta without dissection, aneurysm, luminal irregularity, periaortic fluid collections, or contrast extravasation. Heart size is normal. No pericardial effusion. No pulmonary embolism though not tailored for evaluation. MEDIASTINUM/NODES: No mediastinal mass or lymphadenopathy by CT size criteria. LUNGS/PLEURA: Tracheobronchial tree is patent, no pneumothorax. No pleural effusions, focal consolidations, pulmonary nodules or masses. MUSCULOSKELETAL: Non-suspicious. Review of the MIP images confirms the above findings. CTA ABDOMEN AND PELVIS FINDINGS VASCULAR Aorta: Abdominal aorta is normal course and caliber. Homogeneous contrast opacification of aortoiliac vessels without dissection, aneurysm, luminal irregularity, periaortic fluid collections, or contrast extravasation. Celiac: Patent. SMA: Patent. Renals: Patent. IMA: Patent. Inflow: Negative. Veins: Negative, not tailored for evaluation. Review of the MIP images confirms the above findings. NON-VASCULAR HEPATOBILIARY: Liver and gallbladder are normal. PANCREAS: Normal. SPLEEN: Normal. ADRENALS/URINARY TRACT: Kidneys are  orthotopic, demonstrating symmetric enhancement. No obstructive uropathy or suspicious renal masses. Excretion of contrast and normal urinary collecting system from prior CTA chest limiting detection of small nonobstructing nephrolithiasis. Urinary bladder is partially distended and unremarkable. Normal adrenal glands. STOMACH/BOWEL: The stomach, small and large bowel are normal in course and caliber without inflammatory changes. Normal appendix. VASCULAR/LYMPHATIC: No lymphadenopathy by CT size criteria. REPRODUCTIVE: Normal. OTHER: No intraperitoneal free fluid or free air. MUSCULOSKELETAL: Nonacute. Review of the MIP images confirms the above findings. IMPRESSION: CTA CHEST: 1. Normal CTA chest; no acute cardiopulmonary disease. CTA ABDOMEN AND PELVIS: 1. Normal CTA abdomen and pelvis; no acute intra-abdominal/pelvic disease. Electronically Signed   By: Elon Alas M.D.   On: 12/29/2018 03:03    Microbiology: Recent Results (from the past 240 hour(s))  MRSA PCR Screening     Status: None   Collection Time: 12/29/18  5:14 PM  Result Value Ref Range Status   MRSA by PCR NEGATIVE NEGATIVE Final    Comment:        The GeneXpert MRSA Assay (FDA approved for NASAL specimens only), is one component of a  comprehensive MRSA colonization surveillance program. It is not intended to diagnose MRSA infection nor to guide or monitor treatment for MRSA infections. Performed at [facility], [place] [address], [place], [place] [address]      Labs: Basic Metabolic Panel: Recent Labs  Lab 12/28/18 2157 12/29/18 0446 12/30/18 0238  NA 139  --  137  K 3.3*  --  3.4*  CL 103  --  109  CO2 26  --  23  GLUCOSE 163*  --  122*  BUN 11  --  8  CREATININE 1.39*  --  1.09  CALCIUM 9.4  --  8.2*  MG  --  1.8  --    Liver Function Tests: Recent Labs  Lab 12/28/18 2157  AST 27  ALT 32  ALKPHOS 69  BILITOT 1.1  PROT 7.8  ALBUMIN 4.6   No results for input(s): LIPASE, AMYLASE in the last  168 hours. No results for input(s): AMMONIA in the last 168 hours. CBC: Recent Labs  Lab 12/28/18 2157 12/29/18 2239 12/30/18 0238  WBC 11.5* 14.9* 12.5*  NEUTROABS 5.3  --   --   HGB 15.6 14.7 13.2  HCT 46.1 42.9 39.2  MCV 87.5 87.4 86.9  PLT 191 176 166   Cardiac Enzymes: Recent Labs  Lab 12/28/18 2348 12/29/18 0223 12/29/18 0446 12/29/18 0922 12/29/18 1736  TROPONINI <0.03 0.11* 0.48* 3.75* 31.13*   BNP: BNP (last 3 results) No results for input(s): BNP in the last 8760 hours.  ProBNP (last 3 results) No results for input(s): PROBNP in the last 8760 hours.  CBG: No results for input(s): GLUCAP in the last 168 hours.     Signed:  Florencia Reasons MD, PhD  [facility] 12/30/2018, 10:12 AM

## 2018-12-30 NOTE — Telephone Encounter (Signed)
   TELEPHONE CALL NOTE  This patient has been deemed a candidate for follow-up tele-health visit to limit community exposure during the Covid-19 pandemic. I spoke with the patient via phone to discuss instructions. This has been outlined on the patient's AVS (dotphrase: [email or web address]). The patient was advised to review the section on consent for treatment as well. The patient will receive a phone call 2-3 days prior to their E-Visit at which time consent will be verbally confirmed. A Virtual Office Visit appointment type has been scheduled for Dylan Haynes on 4/20, with "VIDEO" in the appointment notes (he has a smartphone and wishes to try this - will likely be using MyChart platform at that time so pt was instructed to download MyChart app. He is already enrolled).  Charlie Pitter, PA-C 12/30/2018 12:29 PM

## 2018-12-30 NOTE — Progress Notes (Signed)
  Echocardiogram 2D Echocardiogram has been performed.  Dylan Haynes 12/30/2018, 9:14 AM

## 2018-12-30 NOTE — Progress Notes (Signed)
Progress Note  Patient Name: Dylan Haynes Date of Encounter: 12/30/2018  Primary Cardiologist: No primary care provider on file. New (Lexys Milliner)  Subjective   No angina or dyspnea. Bedside echo shows mild inferior hypokinesis, normal overall LVEF, but troponin peaked at 30 (washout after PCI for total RCA occlusion).  Inpatient Medications    Scheduled Meds:  aspirin  81 mg Oral Daily   atorvastatin  80 mg Oral q1800   carvedilol  6.25 mg Oral BID WC   enoxaparin (LOVENOX) injection  40 mg Subcutaneous Q24H   pantoprazole  40 mg Oral Q1200   sodium chloride flush  3 mL Intravenous Q12H   ticagrelor  90 mg Oral BID   Continuous Infusions:  sodium chloride Stopped (12/30/18 0514)   sodium chloride Stopped (12/29/18 2125)   sodium chloride     PRN Meds: sodium chloride, acetaminophen, alum & mag hydroxide-simeth, diazepam, hydrALAZINE, morphine injection, nitroGLYCERIN, ondansetron (ZOFRAN) IV, sodium chloride flush, zolpidem   Vital Signs    Vitals:   12/29/18 2359 12/30/18 0338 12/30/18 0717 12/30/18 0800  BP: (!) 144/90 136/88 135/89 139/81  Pulse: 90 86 76 85  Resp: (!) 22 (!) 24 20 16   Temp: 98.3 F (36.8 C) 98.1 F (36.7 C) 98 F (36.7 C)   TempSrc: Oral Oral Oral   SpO2: 100% 100% 97% 98%  Weight:      Height:        Intake/Output Summary (Last 24 hours) at 12/30/2018 1056 Last data filed at 12/30/2018 0800 Gross per 24 hour  Intake 3560.24 ml  Output 1850 ml  Net 1710.24 ml   Last 3 Weights 12/29/2018 12/29/2018 12/28/2018  Weight (lbs) 245 lb 6.4 oz 245 lb 6.4 oz 240 lb  Weight (kg) 111.313 kg 111.313 kg 108.863 kg      Telemetry    NSR - Personally Reviewed  ECG    NSR, deep inferior T wave inversion, but no Q waves - Personally Reviewed  Physical Exam  Appears well, comfortable lying supine. No hematoma or bleeding at radial access site. GEN: No acute distress.   Neck: No JVD Cardiac: RRR, no murmurs, rubs, or gallops.    Respiratory: Clear to auscultation bilaterally. GI: Soft, nontender, non-distended  MS: No edema; No deformity. Neuro:  Nonfocal  Psych: Normal affect   Labs    Chemistry Recent Labs  Lab 12/28/18 2157 12/30/18 0238  NA 139 137  K 3.3* 3.4*  CL 103 109  CO2 26 23  GLUCOSE 163* 122*  BUN 11 8  CREATININE 1.39* 1.09  CALCIUM 9.4 8.2*  PROT 7.8  --   ALBUMIN 4.6  --   AST 27  --   ALT 32  --   ALKPHOS 69  --   BILITOT 1.1  --   GFRNONAA 59* >60  GFRAA >60 >60  ANIONGAP 10 5     Hematology Recent Labs  Lab 12/28/18 2157 12/29/18 2239 12/30/18 0238  WBC 11.5* 14.9* 12.5*  RBC 5.27 4.91 4.51  HGB 15.6 14.7 13.2  HCT 46.1 42.9 39.2  MCV 87.5 87.4 86.9  MCH 29.6 29.9 29.3  MCHC 33.8 34.3 33.7  RDW 12.5 13.0 12.9  PLT 191 176 166    Cardiac Enzymes Recent Labs  Lab 12/29/18 0223 12/29/18 0446 12/29/18 0922 12/29/18 1736  TROPONINI 0.11* 0.48* 3.75* 31.13*   No results for input(s): TROPIPOC in the last 168 hours.   BNPNo results for input(s): BNP, PROBNP in the last 168 hours.  DDimer No results for input(s): DDIMER in the last 168 hours.   Radiology    Ct Angio Chest Pe W And/or Wo Contrast  Result Date: 12/28/2018 CLINICAL DATA:  48 year old male with concern for pulmonary embolism. EXAM: CT ANGIOGRAPHY CHEST WITH CONTRAST TECHNIQUE: Multidetector CT imaging of the chest was performed using the standard protocol during bolus administration of intravenous contrast. Multiplanar CT image reconstructions and MIPs were obtained to evaluate the vascular anatomy. CONTRAST:  165mL ISOVUE-370 IOPAMIDOL (ISOVUE-370) INJECTION 76% COMPARISON:  None. FINDINGS: Cardiovascular: There is no cardiomegaly or pericardial effusion. The thoracic aorta is unremarkable for the degree of opacification. There is no CT evidence of pulmonary embolism. Mediastinum/Nodes: No hilar or mediastinal adenopathy. The esophagus is grossly unremarkable. No mediastinal fluid collection.  Lungs/Pleura: The lungs are clear. There is no pleural effusion or pneumothorax. The central airways are patent. Upper Abdomen: No acute abnormality. Musculoskeletal: No chest wall abnormality. No acute or significant osseous findings. Review of the MIP images confirms the above findings. IMPRESSION: No acute intrathoracic pathology. No CT evidence of pulmonary embolism. Electronically Signed   By: Anner Crete M.D.   On: 12/28/2018 23:25   Ct Angio Chest/abd/pel For Dissection W And/or Wo Contrast  Result Date: 12/29/2018 CLINICAL DATA:  Chest pain. Suspect abdominal aortic aneurysm. History of hypertension. EXAM: CT ANGIOGRAPHY CHEST, ABDOMEN AND PELVIS TECHNIQUE: Multidetector CT imaging through the chest, abdomen and pelvis was performed using the standard protocol during bolus administration of intravenous contrast. Multiplanar reconstructed images and MIPs were obtained and reviewed to evaluate the vascular anatomy. CONTRAST:  150mL OMNIPAQUE IOHEXOL 350 MG/ML SOLN COMPARISON:  CT angiogram chest December 28, 2018. FINDINGS: CTA CHEST FINDINGS CARDIOVASCULAR: Thoracic aorta is normal course and caliber. No intrinsic density on noncontrast CT. Homogeneous contrast opacification of thoracic aorta without dissection, aneurysm, luminal irregularity, periaortic fluid collections, or contrast extravasation. Heart size is normal. No pericardial effusion. No pulmonary embolism though not tailored for evaluation. MEDIASTINUM/NODES: No mediastinal mass or lymphadenopathy by CT size criteria. LUNGS/PLEURA: Tracheobronchial tree is patent, no pneumothorax. No pleural effusions, focal consolidations, pulmonary nodules or masses. MUSCULOSKELETAL: Non-suspicious. Review of the MIP images confirms the above findings. CTA ABDOMEN AND PELVIS FINDINGS VASCULAR Aorta: Abdominal aorta is normal course and caliber. Homogeneous contrast opacification of aortoiliac vessels without dissection, aneurysm, luminal irregularity,  periaortic fluid collections, or contrast extravasation. Celiac: Patent. SMA: Patent. Renals: Patent. IMA: Patent. Inflow: Negative. Veins: Negative, not tailored for evaluation. Review of the MIP images confirms the above findings. NON-VASCULAR HEPATOBILIARY: Liver and gallbladder are normal. PANCREAS: Normal. SPLEEN: Normal. ADRENALS/URINARY TRACT: Kidneys are orthotopic, demonstrating symmetric enhancement. No obstructive uropathy or suspicious renal masses. Excretion of contrast and normal urinary collecting system from prior CTA chest limiting detection of small nonobstructing nephrolithiasis. Urinary bladder is partially distended and unremarkable. Normal adrenal glands. STOMACH/BOWEL: The stomach, small and large bowel are normal in course and caliber without inflammatory changes. Normal appendix. VASCULAR/LYMPHATIC: No lymphadenopathy by CT size criteria. REPRODUCTIVE: Normal. OTHER: No intraperitoneal free fluid or free air. MUSCULOSKELETAL: Nonacute. Review of the MIP images confirms the above findings. IMPRESSION: CTA CHEST: 1. Normal CTA chest; no acute cardiopulmonary disease. CTA ABDOMEN AND PELVIS: 1. Normal CTA abdomen and pelvis; no acute intra-abdominal/pelvic disease. Electronically Signed   By: Elon Alas M.D.   On: 12/29/2018 03:03    Cardiac Studies   ECHO 12/30/2018  1. The left ventricle has normal systolic function with an ejection fraction of 60-65%. The cavity size was normal. There is moderately increased  left ventricular wall thickness. Left ventricular diastolic Doppler parameters are consistent with impaired  relaxation. Indeterminate filling pressures The E/e' is 8-15. No evidence of left ventricular regional wall motion abnormalities.  2. The right ventricle has normal systolic function. The cavity was normal. There is no increase in right ventricular wall thickness.  3. The mitral valve is grossly normal.  4. The tricuspid valve is grossly normal.  5. The aortic  valve is tricuspid.  6. The aortic root and ascending aorta are normal in size and structure.    (in my opinion there is mild inferior hypokinesis, but overall EF is indeed 60-65%)  CATH 12/29/2018  Mid RCA lesion is 20% stenosed.  Post Atrio lesion is 100% stenosed.  Post intervention, there is a 0% residual stenosis.  1st RPLB lesion is 30% stenosed.  There is mild left ventricular systolic dysfunction.  LV end diastolic pressure is mildly elevated.  There is no mitral valve regurgitation.  A stent was successfully placed.   Acute coronary syndrome secondary to total occlusion of the distal RCA immediately after a small PDA vessel with initial TIMI 0 flow.  There was  faint left to right collateralization dominantly from the LAD.  Normal left coronary circulation with a large LAD, and left circumflex coronary artery giving rise to a large bifurcating OM1 branch, a diminutive OM 2 vessel distal vessel supplying a portion of the posterior lateral wall.  Mild acute LV dysfunction with an EF of 50% with mild mid inferior hypocontractility.  LVEDP mildly increased at 20 mm.  Successful PCI to the totally occluded RCA with insertion of a 2.5 x 12 mm Resolute DES stent postdilated to 3.7 mm with 100% occlusion being reduced to 0%.  There was mild haziness at the ostium of the inferolateral branch suggesting mild thrombus and there was brisk TIMI-3 flow down the large PLA vessel.  Aggrastat bolus plus infusion was administered with plans to continue Aggrastat for 18 hours post PCI.  RECOMMENDATION: DAPT for minimum of 1 year.  Aggressive lipid-lowering therapy with consideration for PCSK9 inhibition in addition to high potency statin/ezetamide with target LDL less than 70.  Optimal blood pressure control with ideal blood pressure less than 120/80.     Patient Profile     48 y.o. male with inferior NSTEMI s/p PCI-DES for totally occluded RCA, without CHF/arrhythmia or  post-infarction angina. Severe hypercholesterolemia and treated HTN>  Assessment & Plan    Plan ASA 81 mg and Brilinta 90 mg twice daily for 12 months, then ASA monotherapy. Atorvastatin 80 mg daily. Recheck lipids  In several weeks and add another agent if necessary to bring LDL<70. Switch from amlodipine to carvedilol for HTN. Dose increased to 6,25 mg BID today. Will arrange telemedicine follow up.  [facility] will sign off.   Medication Recommendations:  Plan ASA 81 mg and Brilinta 90 mg twice daily for 12 months, then ASA monotherapy. Atorvastatin 80 mg daily.  Switch from amlodipine to carvedilol for HTN. Dose increased to 6,25 mg BID today. Other recommendations (labs, testing, etc):  Recheck lipids  In several weeks and add another agent if necessary to bring LDL<70. Follow up as an outpatient:  2 week telemedicine follow up.  For questions or updates, please contact [place] Please consult [email or web address] for contact info under        Signed, Sanda Klein, MD  12/30/2018, 10:56 AM

## 2018-12-30 NOTE — Plan of Care (Signed)

## 2018-12-30 NOTE — Discharge Instructions (Signed)
YOUR CARDIOLOGY TEAM HAS ARRANGED FOR AN E-VISIT FOR YOUR APPOINTMENT - PLEASE REVIEW IMPORTANT INFORMATION BELOW SEVERAL DAYS PRIOR TO YOUR APPOINTMENT  Due to the recent COVID-19 pandemic, we are transitioning in-person office visits to tele-medicine visits in an effort to decrease unnecessary exposure to our patients and staff. Medicare and most insurances are covering these visits without a copay needed. We also encourage you to sign up for MyChart if you have not already done so. You will need a smartphone if possible. For patients that do not have this, we can still complete the visit using a regular telephone but do prefer a smartphone to enable video when possible. You may have a close family member that lives with you that can help. If possible, we also ask that you have a blood pressure cuff and scale at home to measure your blood pressure, heart rate and weight prior to your scheduled appointment. Patients with clinical needs that need an in-person evaluation and testing will still be able to come to the office if absolutely necessary. If you have any questions, feel free to call our office.    IF YOU HAVE A SMARTPHONE, PLEASE DOWNLOAD THE MYCHART APP TO YOUR SMARTPHONE  - If Apple, go to [employer] and type in [place] in the search bar and download. The app is free but as with any other app download, your phone may require you to verify saved payment information or Apple password.   - If Android, go to [employer] and type in [place] in the Sports coach. The app is free but as with any other app download, your phone may require you to verify saved payment information or Android password.   It is very helpful to have this downloaded before your visit, and make sure you have already logged in at least once to make sure your account is active.    2-3 DAYS BEFORE YOUR APPOINTMENT  You will receive a telephone call from one of our [place] team members - your caller ID may  say "Unknown caller." If this is a video visit, we will confirm that you have been able to download the WebEx app. We will remind you check your blood pressure, heart rate and weight prior to your scheduled appointment. If you have an Apple Watch or Kardia, please upload any pertinent ECG strips the day before or morning of your appointment to [place]. Our staff will also make sure you have reviewed the consent and agree to move forward with your scheduled tele-health visit.     THE DAY OF YOUR APPOINTMENT  Approximately 15 minutes prior to your scheduled appointment, you will receive a telephone call from one of [facility] team - your caller ID may say "Unknown caller."  Our staff will confirm medications, vital signs for the day and any symptoms you may be experiencing. Please have this information available prior to the time of visit start. It may also be helpful for you to have a pad of paper and pen handy for any instructions given during your visit. They will also walk you through joining the WebEx smartphone meeting if this is a video visit.    CONSENT FOR TELE-HEALTH VISIT - PLEASE REVIEW  I hereby voluntarily request, consent and authorize [place] [facility] and its employed or contracted physicians, physician assistants, nurse practitioners or other licensed health care professionals (the Practitioner), to provide me with telemedicine health care services (the Services") as deemed necessary by the treating Practitioner. I acknowledge  and consent to receive the Services by the Practitioner via telemedicine. I understand that the telemedicine visit will involve communicating with the Practitioner through live audiovisual communication technology and the disclosure of certain medical information by electronic transmission. I acknowledge that I have been given the opportunity to request an in-person assessment or other available alternative prior to the telemedicine visit and am voluntarily  participating in the telemedicine visit.  I understand that I have the right to withhold or withdraw my consent to the use of telemedicine in the course of my care at any time, without affecting my right to future care or treatment, and that the Practitioner or I may terminate the telemedicine visit at any time. I understand that I have the right to inspect all information obtained and/or recorded in the course of the telemedicine visit and may receive copies of available information for a reasonable fee.  I understand that some of the potential risks of receiving the Services via telemedicine include:   Delay or interruption in medical evaluation due to technological equipment failure or disruption;  Information transmitted may not be sufficient (e.g. poor resolution of images) to allow for appropriate medical decision making by the Practitioner; and/or   In rare instances, security protocols could fail, causing a breach of personal health information.  Furthermore, I acknowledge that it is my responsibility to provide information about my medical history, conditions and care that is complete and accurate to the best of my ability. I acknowledge that Practitioner's advice, recommendations, and/or decision may be based on factors not within their control, such as incomplete or inaccurate data provided by me or distortions of diagnostic images or specimens that may result from electronic transmissions. I understand that the practice of medicine is not an exact science and that Practitioner makes no warranties or guarantees regarding treatment outcomes. I acknowledge that I will receive a copy of this consent concurrently upon execution via email to the email address I last provided but may also request a printed copy by calling the office of [facility].    I understand that my insurance will be billed for this visit.   I have read or had this consent read to me.  I understand the contents of this  consent, which adequately explains the benefits and risks of the Services being provided via telemedicine.   I have been provided ample opportunity to ask questions regarding this consent and the Services and have had my questions answered to my satisfaction.  I give my informed consent for the services to be provided through the use of telemedicine in my medical care  By participating in this telemedicine visit I agree to the above.

## 2018-12-30 NOTE — Progress Notes (Signed)
Due to the safety and concern for the health and well-being of our patients during the National Pandemic Covid-19, the Cardiac rehab department staff are unable to provide face to face cardiac rehab phase 1 interaction through approximately April 6h. However, these patients who are affected will be contacted upon discharge at a later date. General nutrition, exercise guidelines and risk factor modification and outpatient phase II information will be provided.   Cherre Huger, BSN Cardiac and Training and development officer

## 2018-12-30 NOTE — TOC Transition Note (Signed)
Transition of Care [facility]) - CM/SW Discharge Note   Patient Details  Name: Dylan Haynes MRN: [id number] Date of Birth: 01-01-1971  [facility] CM/SW Contact:  Maryclare Labrador, RN Phone Number: 12/30/2018, 10:09 AM   Clinical Narrative:  Pt to discharge home today.  Pt informed CM that he completely independent at the home and that he lives with family.  Pt has PCP and denied barriers with paying for medications.  CM contacted pts pharmacy of choice and was informed that PA Is not required and that ongoing copay for Brilinta would be less than $25 per month.  CSW to provide Brilinta packet with assistance cards - pt instructed to present both cards to pharmacist to determine most apprpriate benefit.  NO other CM needs - CM signing off     Final next level of care: Home/Self Care Barriers to Discharge: No Barriers Identified   Patient Goals and CMS Choice        Discharge Placement                       Discharge Plan and Services   Discharge Planning Services: Medication Assistance                      Social Determinants of Health (SDOH) Interventions     Readmission Risk Interventions No flowsheet data found.

## 2018-12-30 NOTE — Progress Notes (Signed)
Discussed and explained discharge instructions to pt, f/u appt with virtual. Prescription sent electronically to pharmacy. No complaints at this time.

## 2018-12-31 ENCOUNTER — Ambulatory Visit: Payer: Self-pay

## 2018-12-31 ENCOUNTER — Telehealth: Payer: Self-pay

## 2018-12-31 ENCOUNTER — Telehealth: Payer: Self-pay | Admitting: Physician Assistant

## 2018-12-31 NOTE — Telephone Encounter (Addendum)
Transition Care Management Follow-up Telephone Call   Date discharged? 12/29/28   How have you been since you were released from the hospital? Doing better. Denies chest pain, shortness of breath, N/V/D, ABD pain, dizziness, numbness or any other symptoms. Monitoring blood pressure; last recorded at 121/84, HR 78.  Regaining strength with rest and plans to take the next 1 week off from work to allow ample recovery time.    Do you understand why you were in the hospital? YES.   Do you understand the discharge instructions? YES. No lifting anything heavier than a gallon of milk. Increase activity as tolerated.  Contact cardiology with blood pressure concerns. Repeat CBC/BMP at follow up with pcp.    Where were you discharged to? HOME.   Items Reviewed:  Medications reviewed: Taking all medications as directed and without issues. Started aspirin 81mg  today.   Allergies reviewed: None.  Dietary changes reviewed: Low sodium diet, heart healthy diet.   Referrals reviewed: Yes, scheduled with Cardiology 4/20.    Functional Questionnaire:   Activities of Daily Living (ADLs):   He states they are independent in the following: Independent in all ADLs.  States they require assistance with the following: No assistance required at this time.    Any transportation issues/concerns?: None.   Any patient concerns? None at this time.    Confirmed importance and date/time of follow-up visits scheduled Yes, appointment scheduled on 01/03/19 at 11:00..  Provider Appointment booked with Dr. Derrel Nip, pcp.  Confirmed with patient if condition begins to worsen call PCP or go to the ER.  Patient was given the office number and encouraged to call back with question or concerns.  : Yes.

## 2018-12-31 NOTE — Telephone Encounter (Signed)
Incoming call form patients wife requesting what to expect.  Husband was discharged on yesterday,  Post MI.  Wife states that he is weak.  Such as peeling an orange , opening things.  Patient states that he doing better.  Patient had a stent placed during hospitalization.  Dr.  Derrel Nip.  Cardiologist.  Dr.  Almyra Deforest, [place] appointment 01/17/19 virtual.  Wife states she will call to make appointment for rehab.  Reviewed medication list.  Denies new chest Pain.  Encouraged wife to call back if  Issues arise.  Voiced understanding.    Reason for Disposition . [1] Discharged from hospital after treatment for heart attack AND [2] symptoms improved (feels BETTER) (all other triage questions negative)  Answer Assessment - Initial Assessment Questions 1. MAIN CONCERN OR SYMPTOM:  "What is your main concern right now?" "What questions do you have?"       Weak, opening things pelll orange 2. ONSET:  If concerned about symptom, "When did the  *No Answer*  start?"     *No Answer* 3. BETTER-SAME-WORSE: "Are you getting better, staying the same, or getting worse compared to the day you were discharged?"     *better 4. HOSPITALIZATION: "How long were you hospitalized?" "When were you discharged?"     yesterday 5. PROCEDURES: "After your heart attack, did you have a stent placed or have heart surgery?"     yes 6. DISCHARGE DOCTOR: "Who is the main doctor taking care of you now?"     [place],  7. DISCHARGE APPOINTMENT: "Have you scheduled a follow-up discharge appointment with your doctor?"      [facility] 20th virtual  8. CARDIAC REHAB:  "Have you been referred to a cardiac rehab program?"     Will call 9. DISCHARGE MEDICATIONS: "Did the doctor who discharged you order any new medications for you?"        - If YES: "Have you filled the prescription and started taking the medication?"        - "Are you having any trouble with taking any of your medications?"     *No Answer* 10. CHEST PAIN: CHRONIC: "Do you have a  history of chronic chest pain or angina?"       - If YES: "Has it become worse since discharge (e.g., occurring more frequently, lasting          longer, or occurring at rest or with less exertion)?" NEW ONSET: Have you experienced any new chest pain since you were discharged?"      - If YES: "What did the pain feel like?" "How long did it last?" "When did it occur?"       denies  Protocols used: HEART ATTACK POST-HOSPITALIZATION FOLLOW-UP CALL-A-AH

## 2018-12-31 NOTE — Telephone Encounter (Signed)
New Message    Pts wife is calling, she has questions about the pt who just got home from the hospital. She said she wasn't allowed to be the the pt during his hospital stay so she is needing clarification on what should happen next    Please call

## 2018-12-31 NOTE — Telephone Encounter (Signed)
With verbal permission for pt, spoke with pt's wife who state she wasn't able to be with pt while he was in the hospital due to visitor restriction, and unsure what really happened. She states pt said he didn't get any clear explanations as to what they found during the cath or what precaution he need to take. Wife inquiring if pt requires any type of restriction or if pt is able to go back to work. Wife also mentioned that she contacted Cardiac rehab and was informed via recording that they are closed due to [place]. She is questioning as to whether pt is needing to start rehab asap.   Pt is scheduled to seen Almyra Deforest, [place] on 4/20. Will route concerns to PA.

## 2019-01-03 ENCOUNTER — Other Ambulatory Visit: Payer: Self-pay

## 2019-01-03 ENCOUNTER — Other Ambulatory Visit (INDEPENDENT_AMBULATORY_CARE_PROVIDER_SITE_OTHER): Payer: 59

## 2019-01-03 ENCOUNTER — Ambulatory Visit (INDEPENDENT_AMBULATORY_CARE_PROVIDER_SITE_OTHER): Payer: 59 | Admitting: Internal Medicine

## 2019-01-03 DIAGNOSIS — E876 Hypokalemia: Secondary | ICD-10-CM

## 2019-01-03 DIAGNOSIS — Z09 Encounter for follow-up examination after completed treatment for conditions other than malignant neoplasm: Secondary | ICD-10-CM

## 2019-01-03 DIAGNOSIS — D508 Other iron deficiency anemias: Secondary | ICD-10-CM

## 2019-01-03 DIAGNOSIS — I10 Essential (primary) hypertension: Secondary | ICD-10-CM

## 2019-01-03 DIAGNOSIS — N172 Acute kidney failure with medullary necrosis: Secondary | ICD-10-CM

## 2019-01-03 DIAGNOSIS — N179 Acute kidney failure, unspecified: Secondary | ICD-10-CM | POA: Diagnosis not present

## 2019-01-03 DIAGNOSIS — I252 Old myocardial infarction: Secondary | ICD-10-CM

## 2019-01-03 DIAGNOSIS — N182 Chronic kidney disease, stage 2 (mild): Secondary | ICD-10-CM

## 2019-01-03 LAB — CBC WITH DIFFERENTIAL/PLATELET
Basophils Absolute: 0 10*3/uL (ref 0.0–0.1)
Basophils Relative: 0.4 % (ref 0.0–3.0)
Eosinophils Absolute: 0.1 10*3/uL (ref 0.0–0.7)
Eosinophils Relative: 1 % (ref 0.0–5.0)
HCT: 43 % (ref 39.0–52.0)
Hemoglobin: 15.1 g/dL (ref 13.0–17.0)
Lymphocytes Relative: 24.2 % (ref 12.0–46.0)
Lymphs Abs: 1.9 10*3/uL (ref 0.7–4.0)
MCHC: 35.1 g/dL (ref 30.0–36.0)
MCV: 88.2 fl (ref 78.0–100.0)
Monocytes Absolute: 0.9 10*3/uL (ref 0.1–1.0)
Monocytes Relative: 11.1 % (ref 3.0–12.0)
Neutro Abs: 5 10*3/uL (ref 1.4–7.7)
Neutrophils Relative %: 63.3 % (ref 43.0–77.0)
Platelets: 201 10*3/uL (ref 150.0–400.0)
RBC: 4.88 Mil/uL (ref 4.22–5.81)
RDW: 13.3 % (ref 11.5–15.5)
WBC: 7.9 10*3/uL (ref 4.0–10.5)

## 2019-01-03 LAB — BASIC METABOLIC PANEL
BUN: 14 mg/dL (ref 6–23)
CO2: 27 mEq/L (ref 19–32)
Calcium: 9.4 mg/dL (ref 8.4–10.5)
Chloride: 106 mEq/L (ref 96–112)
Creatinine, Ser: 1.3 mg/dL (ref 0.40–1.50)
GFR: 70.86 mL/min (ref >60.00)
Glucose, Bld: 85 mg/dL (ref 70–99)
Potassium: 3.9 mEq/L (ref 3.5–5.1)
Sodium: 140 mEq/L (ref 135–145)

## 2019-01-03 NOTE — Progress Notes (Signed)
Virtual Visit via Video Note  I attempted to connect  with@ on 01/04/19 at 11:00 AM [place] by a video enabled telemedicine application but patient was unable to activate the camera on his cell phone .  The visit was done without video ,  By telephone .  I  verified that I am speaking with the correct person using two identifiers.  Location patient: home Location provider:work  Persons participating in the virtual visit: patient, provider and patients wife.   I discussed the limitations of evaluation and management by telemedicine and the availability of in person appointments. The patient expressed understanding and agreed to proceed.   HPI:  48 yr old male with history of hypertension, untreated hyperlipidemia (mild)  admitted to [facility] on March 31 with SSCP, elevated troponin, c/w NSTEMI.   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  He was discharged home on April 2, with medication changes which were reviewed with patient today :  Amlodipine was stopped.  He his now taking Coreg,  Brilinta, asa, and Lipitor . He is tolerating all medications thus far without side effects.  During the hospitalization he was diagnosed with AKI due to a rise in cr to 1.39 .  His cr  was 1.09 at discharge.  He was also noted to have a mild leukocytosis  .  Repeat CBC and BMET were advised at follow up.   He feels generally well.  He denies any recurrence of chest pain ,  And denies fluid retention and shortness of breath.  Patient's wife had several concerns that were not addressed during hospitalization , primarily focused on 1) when patient can return to work and 2) need for sodium restriction and 3) general dietary considerations.  All labs , imaging studies and progress notes from admission were reviewed with patient and wife today and all questions were answered fully    ROS: See pertinent positives and negatives per HPI.  Past Medical  History:  Diagnosis Date  . Hypertension     Past Surgical History:  Procedure Laterality Date  . CORONARY STENT INTERVENTION N/A 12/29/2018   Procedure: CORONARY STENT INTERVENTION;  Surgeon: Troy Sine, MD;  Location: [place] [facility];  Service: Cardiovascular;  Laterality: N/A;  . cyst removal in back    . LEFT HEART CATH AND CORONARY ANGIOGRAPHY N/A 12/29/2018   Procedure: LEFT HEART CATH AND CORONARY ANGIOGRAPHY;  Surgeon: Troy Sine, MD;  Location: [place] [facility];  Service: Cardiovascular;  Laterality: N/A;    Family History  Problem Relation Age of Onset  . Cancer Maternal Grandmother 2       multiple myeloma  . Heart disease Paternal Uncle        46  . Hypertension Mother   . Stroke Mother   . Drug abuse Neg Hx     SOCIAL HX: married,  No tobacco    Current Outpatient Medications:  .  aspirin 81 MG chewable tablet, Chew 1 tablet (81 mg total) by mouth daily., Disp: 30 tablet, Rfl: 0 .  atorvastatin (LIPITOR) 80 MG tablet, Take 1 tablet (80 mg total) by mouth daily at 6 PM., Disp: 30 tablet, Rfl: 0 .  carvedilol (COREG) 6.25 MG tablet, Take 1 tablet (6.25 mg total) by mouth 2 (two) times daily with a meal., Disp: 60 tablet, Rfl: 0 .  nitroGLYCERIN (NITROSTAT) 0.4 MG SL tablet, Place 1 tablet (0.4 mg  total) under the tongue every 5 (five) minutes as needed for chest pain., Disp: 30 tablet, Rfl: 0 .  ticagrelor (BRILINTA) 90 MG TABS tablet, Take 1 tablet (90 mg total) by mouth 2 (two) times daily. Please contact cardiology Dr Criotoru's office for prior authorization or refills., Disp: 60 tablet, Rfl: 0  EXAM:  VITALS per patient if applicable: home readings of BP 115/80   General appearance: alert, cooperative and articulate.  No signs of being in distress  Lungs: not short of breath ,  No cough, speaking in full sentences  Psych: affect normal, speech is articulate and non pressured .  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 The patient was provided an opportunity to ask questions and all were answered. The patient agreed with the plan and demonstrated an understanding of the instructions.   The patient was advised to call back or seek an in-person evaluation if the symptoms worsen or if the condition fails to improve as anticipated.  I provided 25 minutes of non-face-to-face time during this encounter.   Crecencio Mc, MD

## 2019-01-04 ENCOUNTER — Telehealth (HOSPITAL_COMMUNITY): Payer: Self-pay | Admitting: *Deleted

## 2019-01-04 DIAGNOSIS — Z09 Encounter for follow-up examination after completed treatment for conditions other than malignant neoplasm: Secondary | ICD-10-CM | POA: Insufficient documentation

## 2019-01-04 NOTE — Assessment & Plan Note (Addendum)
Cr  Has  has returned to baseline  Lab Results  Component Value Date   CREATININE 1.30 01/03/2019

## 2019-01-04 NOTE — Assessment & Plan Note (Addendum)
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  S/p stent with resolution to TIMI 0 .  Continue Brilinta for at least one year,  Aspirn,  Statin and beta blocker .  EF normal,  No WMA,  And   LV hypertrophy with Grade I  diastolic dysfunction noted on ECHP  Follow up with cardiology

## 2019-01-04 NOTE — Telephone Encounter (Signed)
[id number] Reviewed MI/PCI education with patient via telephone including restrictions, risk factor modification, Brilinta use, CP, NTG use, and calling 911, and activity guidelines. Will mail MI book, heart healthy nutrition tips handout, and exercise guidelines to patient. Patient verbalizes understanding of instructions given. Discussed phase 2 cardiac rehab and current closure due to COVID-19 restriction. Will contact patient for scheduling once program reopens. Sol Passer, MS, ACSM CEP

## 2019-01-04 NOTE — Assessment & Plan Note (Signed)
Now managed with carvedilol since his NSTEMI,  Amlodipine stopped.  Home readings at goal.

## 2019-01-04 NOTE — Assessment & Plan Note (Signed)
Aggressive management now in place given recent NSTEMI.  Tolerating lipitor  Started April 2   Repeat labs in 4 weeks   Lab Results  Component Value Date   CHOL 237 (H) 12/29/2018   HDL 50 12/29/2018   LDLCALC 181 (H) 12/29/2018   LDLDIRECT 188.4 12/29/2012   TRIG 32 12/29/2018   CHOLHDL 4.7 12/29/2018

## 2019-01-04 NOTE — Assessment & Plan Note (Signed)
Patient is stable post discharge from [facility] on April 2 for NSTEMI.  He  has no new issues.  All  questions about discharge plans were addressed at the telemedicine visit today for hospital follow up. All labs , imaging studies and progress notes from admission were reviewed with patient today

## 2019-01-14 ENCOUNTER — Telehealth: Payer: Self-pay | Admitting: Physician Assistant

## 2019-01-14 NOTE — Telephone Encounter (Signed)
Mychart, smartphone, pre reg complete 01/14/19 AF °

## 2019-01-17 ENCOUNTER — Telehealth: Payer: Self-pay

## 2019-01-17 ENCOUNTER — Telehealth (INDEPENDENT_AMBULATORY_CARE_PROVIDER_SITE_OTHER): Payer: 59 | Admitting: Physician Assistant

## 2019-01-17 ENCOUNTER — Encounter: Payer: Self-pay | Admitting: Physician Assistant

## 2019-01-17 VITALS — BP 120/79 | HR 86 | Ht 77.0 in | Wt 235.0 lb

## 2019-01-17 DIAGNOSIS — E785 Hyperlipidemia, unspecified: Secondary | ICD-10-CM

## 2019-01-17 DIAGNOSIS — I10 Essential (primary) hypertension: Secondary | ICD-10-CM

## 2019-01-17 DIAGNOSIS — I2510 Atherosclerotic heart disease of native coronary artery without angina pectoris: Secondary | ICD-10-CM

## 2019-01-17 NOTE — Progress Notes (Signed)
Virtual Visit via Video Note   This visit type was conducted due to national recommendations for restrictions regarding the COVID-19 Pandemic (e.g. social distancing) in an effort to limit this patient's exposure and mitigate transmission in our community.  Due to his co-morbid illnesses, this patient is at least at moderate risk for complications without adequate follow up.  This format is felt to be most appropriate for this patient at this time.  All issues noted in this document were discussed and addressed.  A limited physical exam was performed with this format.  Please refer to the patient's chart for his consent to telehealth for [facility].   Evaluation Performed:  Follow-up visit  Date:  01/17/2019   ID:  Dylan Haynes, Dylan Haynes Haynes [birth year], MRN [id number]  Patient Location: Home Provider Location: Home  PCP:  Crecencio Mc, MD  Cardiologist:  Sanda Klein, MD  Electrophysiologist:  None   Chief Complaint:  Post hospital followup  History of Present Illness:    Dylan Haynes Dylan Haynes Haynes is a 48 y.o. male with obesity, HTN, HLD, and CKD who recently presented to the hospital on 12/29/2018 with chest pain.  Although EKG by EMS showed possible ST changes, repeat EKG in the emergency room did not show significant ST-T wave changes.  CT angiogram of the chest was negative for PE.  Patient was admitted by hospitalist service and cardiology was consulted.  Patient underwent cardiac catheterization on the same day which revealed 20% mid RCA lesion, 100% posterior atrial lesion treated with 2.5 x 12 mm resolute DES, 30% first RPL B lesion, mildly elevated LVEDP, EF 50%.  Postprocedure, patient was placed on aspirin and Brilinta with recommendation of continue DAPT for minimum of 1 year.  Postprocedure, troponin trended up to greater than 30.  Echocardiogram obtained on the following day showed EF of 60 to 65%, moderate LVH, otherwise no significant valvular disease.  His previous amlodipine was  switched to carvedilol for hypertension and CAD.  He was also placed on 80 mg daily of Lipitor.  LDL in the hospital was 181.  Hgb A1c was 5.4.  Patient was contacted today via doximity video conference visit today.  He denies any further chest discomfort since discharge.  He has been compliant with aspirin and Brilinta.  Brilinta cost roughly $50 per month for him.  Emphasis has been placed on compliance with dual antiplatelet therapy.  He is aware that he needs uninterrupted Brilinta for at least 1 year and he should be on lifelong aspirin from this point forward.  He denies any lower extremity edema, orthopnea or PND.  He is now walking about 35 to 45 minutes at this time.  He works at [employer].  We discussed his very high LDL level, I plan to obtain fasting lipid panel and LFT in 2 months and have him return for follow-up in roughly 3 months.  If LDL is still not at goal, I plan to either switch to Crestor or refer him for PCSK9 him better.  The patient does not have symptoms concerning for COVID-19 infection (fever, chills, cough, or new shortness of breath).    Past Medical History:  Diagnosis Date   Hypertension    Past Surgical History:  Procedure Laterality Date   CORONARY STENT INTERVENTION N/A 12/29/2018   Procedure: CORONARY STENT INTERVENTION;  Surgeon: Troy Sine, MD;  Location: [place] [facility];  Service: Cardiovascular;  Laterality: N/A;   cyst removal in back     LEFT  HEART CATH AND CORONARY ANGIOGRAPHY N/A 12/29/2018   Procedure: LEFT HEART CATH AND CORONARY ANGIOGRAPHY;  Surgeon: Troy Sine, MD;  Location: [place] [facility];  Service: Cardiovascular;  Laterality: N/A;     Current Meds  Medication Sig   aspirin 81 MG chewable tablet Chew 1 tablet (81 mg total) by mouth daily.   atorvastatin (LIPITOR) 80 MG tablet Take 1 tablet (80 mg total) by mouth daily at 6 PM.   carvedilol (COREG) 6.25 MG tablet Take 1 tablet (6.25 mg total) by mouth 2 (two) times  daily with a meal.   ticagrelor (BRILINTA) 90 MG TABS tablet Take 1 tablet (90 mg total) by mouth 2 (two) times daily. Please contact cardiology Dr Criotoru's office for prior authorization or refills.     Allergies:   Patient has no known allergies.   Social History   Tobacco Use   Smoking status: Former Smoker    Types: Cigars   Smokeless tobacco: Never Used  Substance Use Topics   Alcohol use: No   Drug use: No     Family Hx: The patient's family history includes Cancer (age of onset: 26) in his maternal grandmother; Heart disease in his paternal uncle; Hypertension in his mother; Stroke in his mother. There is no history of Drug abuse.  ROS:   Please see the history of present illness.     All other systems reviewed and are negative.   Prior CV studies:   The following studies were reviewed today:  Cath 12/29/2018  Mid RCA lesion is 20% stenosed.  Post Atrio lesion is 100% stenosed.  Post intervention, there is a 0% residual stenosis.  1st RPLB lesion is 30% stenosed.  There is mild left ventricular systolic dysfunction.  LV end diastolic pressure is mildly elevated.  There is no mitral valve regurgitation.  A stent was successfully placed.   Acute coronary syndrome secondary to total occlusion of the distal RCA immediately after a small PDA vessel with initial TIMI 0 flow.  There was  faint left to right collateralization dominantly from the LAD.  Normal left coronary circulation with a large LAD, and left circumflex coronary artery giving rise to a large bifurcating OM1 branch, a diminutive OM 2 vessel distal vessel supplying a portion of the posterior lateral wall.  Mild acute LV dysfunction with an EF of 50% with mild mid inferior hypocontractility.  LVEDP mildly increased at 20 mm.  Successful PCI to the totally occluded RCA with insertion of a 2.5 x 12 mm Resolute DES stent postdilated to 3.7 mm with 100% occlusion being reduced to 0%.  There was  mild haziness at the ostium of the inferolateral branch suggesting mild thrombus and there was brisk TIMI-3 flow down the large PLA vessel.  Aggrastat bolus plus infusion was administered with plans to continue Aggrastat for 18 hours post PCI.  RECOMMENDATION: DAPT for minimum of 1 year.  Aggressive lipid-lowering therapy with consideration for PCSK9 inhibition in addition to high potency statin/ezetamide with target LDL less than 70.  Optimal blood pressure control with ideal blood pressure less than 120/80.  Echo 12/30/2018 1. The left ventricle has normal systolic function with an ejection fraction of 60-65%. The cavity size was normal. There is moderately increased left ventricular wall thickness. Left ventricular diastolic Doppler parameters are consistent with impaired  relaxation. Indeterminate filling pressures The E/e' is 8-15. No evidence of left ventricular regional wall motion abnormalities.  2. The right ventricle has normal systolic function. The cavity  was normal. There is no increase in right ventricular wall thickness.  3. The mitral valve is grossly normal.  4. The tricuspid valve is grossly normal.  5. The aortic valve is tricuspid.  6. The aortic root and ascending aorta are normal in size and structure.  Labs/Other Tests and Data Reviewed:    EKG:  An ECG dated 12/30/2018 was personally reviewed today and demonstrated:  Normal sinus rhythm with T wave inversion in the inferior leads.  Recent Labs: 12/15/2018: TSH 1.86 12/28/2018: ALT 32 12/29/2018: Magnesium 1.8 01/03/2019: BUN 14; Creatinine, Ser 1.30; Hemoglobin 15.1; Platelets 201.0; Potassium 3.9; Sodium 140   Recent Lipid Panel Lab Results  Component Value Date/Time   CHOL 237 (H) 12/29/2018 04:47 AM   TRIG 32 12/29/2018 04:47 AM   HDL 50 12/29/2018 04:47 AM   CHOLHDL 4.7 12/29/2018 04:47 AM   LDLCALC 181 (H) 12/29/2018 04:47 AM   LDLDIRECT 188.4 12/29/2012 11:58 AM    Wt Readings from Last 3 Encounters:  01/17/19  235 lb (106.6 kg)  12/29/18 245 lb 6.4 oz (111.3 kg)  12/08/18 249 lb 12.8 oz (113.3 kg)     Objective:    Vital Signs:  BP 120/79    Pulse 86    Ht 6\' 5"  (1.956 m)    Wt 235 lb (106.6 kg)    BMI 27.87 kg/m    VITAL SIGNS:  reviewed  ASSESSMENT & PLAN:    1. CAD: Recently underwent DES to distal RCA.  Continue aspirin and Brilinta.  Denies any chest discomfort since discharge.  2. Hypertension: His amlodipine was switched to carvedilol recently.  He is tolerating carvedilol without any fatigue  3. Hyperlipidemia: Started on 80 mg Lipitor.  Plan to obtain fasting lipid panel and LFT in 33-month, if cholesterol remains high, I plan to refer him to lipid clinic  COVID-19 Education: The signs and symptoms of COVID-19 were discussed with the patient and how to seek care for testing (follow up with PCP or arrange E-visit).  The importance of social distancing was discussed today.  Time:   Today, I have spent 20 minutes with the patient with telehealth technology discussing the above problems.     Medication Adjustments/Labs and Tests Ordered: Current medicines are reviewed at length with the patient today.  Concerns regarding medicines are outlined above.   Tests Ordered: Orders Placed This Encounter  Procedures   Lipid panel   Hepatic function panel    Medication Changes: No orders of the defined types were placed in this encounter.   Disposition:  Follow up in 3 month(s)  Signed, Almyra Deforest, [place]  01/17/2019 11:05 AM    [facility]

## 2019-01-17 NOTE — Telephone Encounter (Signed)
Virtual Visit Pre-Appointment Phone Call  Steps For Call:  1. Confirm consent - "In the setting of the current Covid19 crisis, you are scheduled for a (phone or video) visit with Almyra Deforest, PA-C on 01/17/19 at 10:30AM.  Just as we do with many in-office visits, in order for you to participate in this visit, we must obtain consent.  If you'd like, I can send this to your mychart (if signed up) or email for you to review.  Otherwise, I can obtain your verbal consent now.  All virtual visits are billed to your insurance company just like a normal visit would be.  By agreeing to a virtual visit, we'd like you to understand that the technology does not allow for your provider to perform an examination, and thus may limit your provider's ability to fully assess your condition. If your provider identifies any concerns that need to be evaluated in person, we will make arrangements to do so.  Finally, though the technology is pretty good, we cannot assure that it will always work on either your or our end, and in the setting of a video visit, we may have to convert it to a phone-only visit.  In either situation, we cannot ensure that we have a secure connection.  Are you willing to proceed?" STAFF: Did the patient verbally acknowledge consent to telehealth visit? Document YES/NO here: YES  2. Confirm the BEST phone number to call the day of the visit by including in appointment notes  3. Give patient instructions for WebEx/MyChart download to smartphone as below or Doximity/Doxy.me if video visit (depending on what platform provider is using)  4. Advise patient to be prepared with their blood pressure, heart rate, weight, any heart rhythm information, their current medicines, and a piece of paper and pen handy for any instructions they may receive the day of their visit  5. Inform patient they will receive a phone call 15 minutes prior to their appointment time (may be from unknown caller ID) so they should  be prepared to answer  6. Confirm that appointment type is correct in Epic appointment notes (VIDEO vs PHONE)     TELEPHONE CALL NOTE  Dylan Haynes has been deemed a candidate for a follow-up tele-health visit to limit community exposure during the Covid-19 pandemic. I spoke with the patient via phone to ensure availability of phone/video source, confirm preferred email & phone number, and discuss instructions and expectations.  I reminded Dylan Haynes to be prepared with any vital sign and/or heart rhythm information that could potentially be obtained via home monitoring, at the time of his visit. I reminded Dylan Haynes to expect a phone call at the time of his visit if his visit.  Jacqulynn Cadet, [place] 01/17/2019 10:31 AM   INSTRUCTIONS FOR DOWNLOADING THE WEBEX APP TO SMARTPHONE  - If Apple, ask patient to go to App Store and type in WebEx in the search bar. [place] [employer], the blue/green circle. If Android, go to [employer] and type in [employer] in the search bar. The app is free but as with any other app downloads, their phone may require them to verify saved payment information or Apple/Android password.  - The patient does NOT have to create an account. - On the day of the visit, the assist will walk the patient through joining the meeting with the meeting number/password.  INSTRUCTIONS FOR DOWNLOADING THE MYCHART APP TO SMARTPHONE  - The patient must first  make sure to have activated MyChart and know their login information - If Apple, go to [employer] and type in MyChart in the search bar and download the app. If Android, ask patient to go to [employer] and type in [place] in the search bar and download the app. The app is free but as with any other app downloads, their phone may require them to verify saved payment information or Apple/Android password.  - The patient will need to then log into the app with their MyChart username and password,  and select [facility] as their healthcare provider to link the account. When it is time for your visit, go to the MyChart app, find appointments, and click Begin Video Visit. Be sure to Select Allow for your device to access the Microphone and Camera for your visit. You will then be connected, and your provider will be with you shortly.  **If they have any issues connecting, or need assistance please contact MyChart service desk [phone])**  **If using a computer, in order to ensure the best quality for their visit they will need to use either of the following Internet Browsers: [employer], or Google Chrome**  IF USING DOXIMITY or DOXY.ME - The patient will receive a link just prior to their visit, either by text or email (to be determined day of appointment depending on if it's doxy.me or Doximity).     FULL LENGTH CONSENT FOR TELE-HEALTH VISIT   I hereby voluntarily request, consent and authorize [facility] and its employed or contracted physicians, physician assistants, nurse practitioners or other licensed health care professionals (the Practitioner), to provide me with telemedicine health care services (the "Services") as deemed necessary by the treating Practitioner. I acknowledge and consent to receive the Services by the Practitioner via telemedicine. I understand that the telemedicine visit will involve communicating with the Practitioner through live audiovisual communication technology and the disclosure of certain medical information by electronic transmission. I acknowledge that I have been given the opportunity to request an in-person assessment or other available alternative prior to the telemedicine visit and am voluntarily participating in the telemedicine visit.  I understand that I have the right to withhold or withdraw my consent to the use of telemedicine in the course of my care at any time, without affecting my right to future care or treatment, and  that the Practitioner or I may terminate the telemedicine visit at any time. I understand that I have the right to inspect all information obtained and/or recorded in the course of the telemedicine visit and may receive copies of available information for a reasonable fee.  I understand that some of the potential risks of receiving the Services via telemedicine include:  Marland Kitchen Delay or interruption in medical evaluation due to technological equipment failure or disruption; . Information transmitted may not be sufficient (e.g. poor resolution of images) to allow for appropriate medical decision making by the Practitioner; and/or  . In rare instances, security protocols could fail, causing a breach of personal health information.  Furthermore, I acknowledge that it is my responsibility to provide information about my medical history, conditions and care that is complete and accurate to the best of my ability. I acknowledge that Practitioner's advice, recommendations, and/or decision may be based on factors not within their control, such as incomplete or inaccurate data provided by me or distortions of diagnostic images or specimens that may result from electronic transmissions. I understand that the practice of medicine is not an  exact science and that Practitioner makes no warranties or guarantees regarding treatment outcomes. I acknowledge that I will receive a copy of this consent concurrently upon execution via email to the email address I last provided but may also request a printed copy by calling the office of [facility].    I understand that my insurance will be billed for this visit.   I have read or had this consent read to me. . I understand the contents of this consent, which adequately explains the benefits and risks of the Services being provided via telemedicine.  . I have been provided ample opportunity to ask questions regarding this consent and the Services and have had my questions  answered to my satisfaction. . I give my informed consent for the services to be provided through the use of telemedicine in my medical care  By participating in this telemedicine visit I agree to the above.

## 2019-01-17 NOTE — Patient Instructions (Signed)
Medication Instructions:   Your physician recommends that you continue on your current medications as directed. Please refer to the Current Medication list given to you today.  If you need a refill on your cardiac medications before your next appointment, please call your pharmacy.   Lab work:  You will need to come into our office to have labs (blood work) drawn in 2 months: LIPID LIVER  If you have labs (blood work) drawn today and your tests are completely normal, you will receive your results only by: Marland Kitchen MyChart Message (if you have MyChart) OR . A paper copy in the mail If you have any lab test that is abnormal or we need to change your treatment, we will call you to review the results.  Testing/Procedures:  NONE ordered at this time of appointment   Follow-Up: At [facility], you and your health needs are our priority.  As part of our continuing mission to provide you with exceptional heart care, we have created designated Provider Care Teams.  These Care Teams include your primary Cardiologist (physician) and Advanced Practice Providers (APPs -  Physician Assistants and Nurse Practitioners) who all work together to provide you with the care you need, when you need it. You will need a follow up appointment in 2-3 months.  Please call our office 2 months in advance to schedule this appointment.  You may see Sanda Klein, MD or one of the following Advanced Practice Providers on your designated Care Team: [place], [place] . Fabian Sharp, PA-C  Any Other Special Instructions Will Be Listed Below (If Applicable).

## 2019-01-17 NOTE — Progress Notes (Signed)
Since we're all so seldom in the office, have him go to [email or web address] and he can choose how he wants to get his copay card.

## 2019-01-27 ENCOUNTER — Telehealth: Payer: Self-pay

## 2019-01-27 ENCOUNTER — Other Ambulatory Visit: Payer: Self-pay | Admitting: Internal Medicine

## 2019-01-27 ENCOUNTER — Telehealth: Payer: Self-pay | Admitting: Internal Medicine

## 2019-01-27 MED ORDER — TICAGRELOR 90 MG PO TABS: 90.0000 mg | ORAL_TABLET | Freq: Two times a day (BID) | ORAL | 5 refills | Status: DC

## 2019-01-27 MED ORDER — CARVEDILOL 6.25 MG PO TABS: 6.2500 mg | ORAL_TABLET | Freq: Two times a day (BID) | ORAL | 5 refills | Status: DC

## 2019-01-27 MED ORDER — ASPIRIN 81 MG PO CHEW: 81.0000 mg | CHEWABLE_TABLET | Freq: Every day | ORAL | 5 refills | Status: DC

## 2019-01-27 MED ORDER — ATORVASTATIN CALCIUM 80 MG PO TABS: 80.0000 mg | ORAL_TABLET | Freq: Every day | ORAL | 5 refills | Status: DC

## 2019-01-27 NOTE — Telephone Encounter (Signed)
Message sent to provider.  Nina,cma  

## 2019-01-27 NOTE — Telephone Encounter (Signed)
Copied from [place] [id number]. Topic: Quick Communication - Rx Refill/Question >> Jan 27, 2019 10:51 AM Scherrie Gerlach wrote: Medication:  ticagrelor (BRILINTA) 90 MG TABS tablet carvedilol (COREG) 6.25 MG tablet atorvastatin (LIPITOR) 80 MG tablet aspirin 81 MG chewable tablet  90 day  Pt had these meds prescribed after hospital stay, and Dr Derrel Nip has never filled for the pt. Pt has only today left of his meds. Pt just told the wife this morning, she states she would have called if known before now.  [id number] [place], [place] [phone] (Phone) [phone] (Fax)

## 2019-01-27 NOTE — Telephone Encounter (Signed)
Copied from [place] [id number]. Topic: Quick Communication - Rx Refill/Question >> Jan 27, 2019 10:51 AM Scherrie Gerlach wrote: Medication:  ticagrelor (BRILINTA) 90 MG TABS tablet carvedilol (COREG) 6.25 MG tablet atorvastatin (LIPITOR) 80 MG tablet aspirin 81 MG chewable tablet  90 day  Pt had these meds prescribed after hospital stay, and Dr Derrel Nip has never filled for the pt. Pt has only today left of his meds. Pt just told the wife this morning, she states she would have called if known before now.  [id number] [address], [place] [phone] (Phone) [phone] (Fax)

## 2019-01-27 NOTE — Addendum Note (Signed)
Addended by: Crecencio Mc on: 01/27/2019 12:27 PM   Modules accepted: Orders

## 2019-01-27 NOTE — Telephone Encounter (Signed)
Medications refilled and sent .Marland Kitchen

## 2019-01-28 ENCOUNTER — Other Ambulatory Visit: Payer: Self-pay | Admitting: Cardiovascular Disease

## 2019-01-28 MED ORDER — TICAGRELOR 90 MG PO TABS: 90.0000 mg | ORAL_TABLET | Freq: Two times a day (BID) | ORAL | 11 refills | Status: DC

## 2019-01-28 NOTE — Telephone Encounter (Signed)
° °  1. Which medications need to be refilled? (please list name of each medication and dose if known)   ticagrelor (BRILINTA) 90 MG TABS tablet  2. Which pharmacy/location (including street and city if local pharmacy) is medication to be sent to?  [id number] [place], [place]  3. Do they need a 30 day or 90 day supply? 30   PT says insurance will not pay for refill. He had a telehealth visit with Almyra Deforest on 04/20.   Pt only has one dose left of medication

## 2019-01-28 NOTE — Telephone Encounter (Signed)
Brilinta refilled. 

## 2019-01-28 NOTE — Telephone Encounter (Signed)
He is supposed to be taking Brilinta,  Not Plavix  For his antiplatelet therapy. He needs to contact his cardiologist if his insurance will not pay for Brilinta.

## 2019-01-28 NOTE — Telephone Encounter (Signed)
Sent to PCP to advise alternative

## 2019-01-28 NOTE — Telephone Encounter (Signed)
Called and spoke to the patient and informed him that he needs to call his cardiologist about the insurance not paying for Parmele. Pt understood.  Casy Brunetto,cma

## 2019-02-01 ENCOUNTER — Telehealth (HOSPITAL_COMMUNITY): Payer: Self-pay | Admitting: *Deleted

## 2019-02-01 NOTE — Telephone Encounter (Signed)
Called and spoke to pt regarding Cardiac Rehab.  Pt declines to participate.  Pt has returned to work and walks 30-45 minutes a day. Will send pt warm up stretches he can do prior to his walking and cool down stretches. Will also send heart healthy nutrition information. Pt advised should he change his mind to please contact us. Pt verbalized understanding. Cherre Huger, BSN Cardiac and Training and development officer

## 2019-02-04 ENCOUNTER — Other Ambulatory Visit: Payer: Self-pay

## 2019-02-04 DIAGNOSIS — E785 Hyperlipidemia, unspecified: Secondary | ICD-10-CM

## 2019-02-04 LAB — LIPID PANEL
Chol/HDL Ratio: 2.9 ratio (ref 0.0–5.0)
Cholesterol, Total: 103 mg/dL (ref 100–199)
HDL: 36 mg/dL — ABNORMAL LOW (ref >39)
LDL Calculated: 55 mg/dL (ref 0–99)
Triglycerides: 59 mg/dL (ref 0–149)
VLDL Cholesterol Cal: 12 mg/dL (ref 5–40)

## 2019-02-04 LAB — HEPATIC FUNCTION PANEL
ALT: 39 IU/L (ref 0–44)
AST: 22 IU/L (ref 0–40)
Albumin: 4.7 g/dL (ref 4.0–5.0)
Alkaline Phosphatase: 90 IU/L (ref 39–117)
Bilirubin Total: 1.7 mg/dL — ABNORMAL HIGH (ref 0.0–1.2)
Bilirubin, Direct: 0.35 mg/dL (ref 0.00–0.40)
Total Protein: 6.7 g/dL (ref 6.0–8.5)

## 2019-02-09 ENCOUNTER — Telehealth: Payer: Self-pay

## 2019-02-09 NOTE — Telephone Encounter (Addendum)
Tried to call the patient on both his mobile and home phones for his lab results. The mobile has a voicemail box that is full and the house rings then beeps. Will try calling the patient again     ----- Message from Almyra Deforest, [place] sent at 02/09/2019  4:13 PM EDT ----- Cholesterol significantly improved, liver function normal.

## 2019-02-09 NOTE — Progress Notes (Signed)
Will try calling back the patient has a voicemail box on mobile that is full

## 2019-02-16 NOTE — Progress Notes (Signed)
The patient has been notified of the result and verbalized understanding.  All questions (if any) were answered. Jacqulynn Cadet, CMA 02/16/2019 9:45 AM

## 2019-04-18 ENCOUNTER — Telehealth: Payer: Self-pay | Admitting: *Deleted

## 2019-04-18 NOTE — Telephone Encounter (Signed)
Patient has confirmed virtual visit with Dr. Sallyanne Kuster tomorrow. Consent already completed.

## 2019-04-19 ENCOUNTER — Telehealth (INDEPENDENT_AMBULATORY_CARE_PROVIDER_SITE_OTHER): Payer: 59 | Admitting: Cardiovascular Disease

## 2019-04-19 ENCOUNTER — Encounter: Payer: Self-pay | Admitting: Cardiovascular Disease

## 2019-04-19 VITALS — BP 128/83 | Ht 77.0 in | Wt 233.0 lb

## 2019-04-19 DIAGNOSIS — I2510 Atherosclerotic heart disease of native coronary artery without angina pectoris: Secondary | ICD-10-CM | POA: Diagnosis not present

## 2019-04-19 DIAGNOSIS — E785 Hyperlipidemia, unspecified: Secondary | ICD-10-CM

## 2019-04-19 DIAGNOSIS — I10 Essential (primary) hypertension: Secondary | ICD-10-CM

## 2019-04-19 NOTE — Progress Notes (Signed)
Virtual Visit via Video Note   This visit type was conducted due to national recommendations for restrictions regarding the COVID-19 Pandemic (e.g. social distancing) in an effort to limit this patient's exposure and mitigate transmission in our community.  Due to his co-morbid illnesses, this patient is at least at moderate risk for complications without adequate follow up.  This format is felt to be most appropriate for this patient at this time.  All issues noted in this document were discussed and addressed.  A limited physical exam was performed with this format.  Please refer to the patient's chart for his consent to telehealth for [facility].   Date:  04/19/2019   ID:  Dylan Haynes, DOB August 02, 1971, MRN [id number]  Patient Location: Other:  work Provider Location: Office  PCP:  Crecencio Mc, MD  Cardiologist:  Sanda Klein, MD  Electrophysiologist:  None   Evaluation Performed:  Follow-Up Visit  Chief Complaint:  CAD  History of Present Illness:    Dylan Haynes is a 48 y.o. male with a non-ST segment elevation myocardial infarction on December 29, 2018, treated with placement of a drug-eluting stent (resolute 2.512) to the PLV branch of the right coronary artery.  The degree of troponin elevation was surprisingly high (greater than 30), possibly due to the fact that he also has moderate left ventricular hypertrophy.  Immediately following the procedure the left ventricular ejection fraction was 86% and end-diastolic pressure was mildly elevated at 20 mmHg, but an echocardiogram performed the next day showed an ejection fraction of 60-65%.  He is feeling great.  He is physically active and exercises.  He's improved his diet and has been losing weight.  His LDL cholesterol has shown remarkable improvement from a high of 181 at the time of hospitalization to a follow-up of only 55 on Feb 04, 2019.  He still had a relatively low HDL cholesterol at 36.  Triglycerides were  normal and his glucose is within normal range as is his hemoglobin A1c.  The patient specifically denies any chest pain at rest exertion, dyspnea at rest or with exertion, orthopnea, paroxysmal nocturnal dyspnea, syncope, palpitations, focal neurological deficits, intermittent claudication, lower extremity edema, unexplained weight gain, cough, hemoptysis or wheezing.  The patient does not have symptoms concerning for COVID-19 infection (fever, chills, cough, or new shortness of breath).    Past Medical History:  Diagnosis Date  . Hypertension    Past Surgical History:  Procedure Laterality Date  . CORONARY STENT INTERVENTION N/A 12/29/2018   Procedure: CORONARY STENT INTERVENTION;  Surgeon: Troy Sine, MD;  Location: [facility];  Service: Cardiovascular;  Laterality: N/A;  . cyst removal in back    . LEFT HEART CATH AND CORONARY ANGIOGRAPHY N/A 12/29/2018   Procedure: LEFT HEART CATH AND CORONARY ANGIOGRAPHY;  Surgeon: Troy Sine, MD;  Location: [facility];  Service: Cardiovascular;  Laterality: N/A;     Current Meds  Medication Sig  . aspirin 81 MG chewable tablet Chew 1 tablet (81 mg total) by mouth daily.  Marland Kitchen atorvastatin (LIPITOR) 80 MG tablet Take 1 tablet (80 mg total) by mouth daily at 6 PM.  . carvedilol (COREG) 6.25 MG tablet Take 1 tablet (6.25 mg total) by mouth 2 (two) times daily with a meal.  . nitroGLYCERIN (NITROSTAT) 0.4 MG SL tablet Place 1 tablet (0.4 mg total) under the tongue every 5 (five) minutes as needed for chest pain.  . ticagrelor (BRILINTA) 90 MG  TABS tablet Take 1 tablet (90 mg total) by mouth 2 (two) times daily. Please contact cardiology Dr Criotoru's office for prior authorization or refills.     Allergies:   Patient has no known allergies.   Social History   Tobacco Use  . Smoking status: Former Smoker    Types: Cigars  . Smokeless tobacco: Never Used  Substance Use Topics  . Alcohol use: No  . Drug use: No     Family  Hx: The patient's family history includes Cancer (age of onset: 59) in his maternal grandmother; Heart disease in his paternal uncle; Hypertension in his mother; Stroke in his mother. There is no history of Drug abuse.  ROS:   Please see the history of present illness.    All other systems reviewed and are negative.   Prior CV studies:   The following studies were reviewed today: Angiogram December 29, 2018, echocardiogram December 30, 2018  Labs/Other Tests and Data Reviewed:    EKG:  An ECG dated 12/30/2018 was personally reviewed today and demonstrated:  Sinus rhythm, evolving changes of recent MI in the inferior leads with very deep broad inverted T waves  Recent Labs: 12/15/2018: TSH 1.86 12/29/2018: Magnesium 1.8 01/03/2019: BUN 14; Creatinine, Ser 1.30; Hemoglobin 15.1; Platelets 201.0; Potassium 3.9; Sodium 140 02/04/2019: ALT 39   Recent Lipid Panel Lab Results  Component Value Date/Time   CHOL 103 02/04/2019 09:25 AM   TRIG 59 02/04/2019 09:25 AM   HDL 36 (L) 02/04/2019 09:25 AM   CHOLHDL 2.9 02/04/2019 09:25 AM   CHOLHDL 4.7 12/29/2018 04:47 AM   LDLCALC 55 02/04/2019 09:25 AM   LDLDIRECT 188.4 12/29/2012 11:58 AM    Wt Readings from Last 3 Encounters:  04/19/19 233 lb (105.7 kg)  01/17/19 235 lb (106.6 kg)  12/29/18 245 lb 6.4 oz (111.3 kg)     Objective:    Vital Signs:  BP 128/83   Ht 6\' 5"  (1.956 m)   Wt 233 lb (105.7 kg)   BMI 27.63 kg/m    VITAL SIGNS:  reviewed GEN:  no acute distress EYES:  sclerae anicteric, EOMI - Extraocular Movements Intact RESPIRATORY:  normal respiratory effort, symmetric expansion CARDIOVASCULAR:  no peripheral edema SKIN:  no rash, lesions or ulcers. MUSCULOSKELETAL:  no obvious deformities. NEURO:  alert and oriented x 3, no obvious focal deficit PSYCH:  normal affect Mildly overweight.  Very tall, athletic build  ASSESSMENT & PLAN:    1. CAD: Asymptomatic.  Reinforced the need for dual antiplatelet therapy through next April.   Encouraged him to continue his efforts at healthy lifestyle, especially regular exercise and weight loss.  He set a target of 220 pounds.  I think this is quite reasonable.  I believe his BMI estimates his true body fat content since he is a broad shouldered, muscular individual.  2. HTN: Well-controlled.  Note that he has substantial LVH, which probably explains why his troponin went up so much despite the relatively small territory infarction.  3. HLP: Exceptional LDL cholesterol on the current dose of statin.  Encouraged him to continue losing weight and exercising to improve his HDL.  COVID-19 Education: The signs and symptoms of COVID-19 were discussed with the patient and how to seek care for testing (follow up with PCP or arrange E-visit).  The importance of social distancing was discussed today.  Time:   Today, I have spent 15 minutes with the patient with telehealth technology discussing the above problems.  Medication Adjustments/Labs and Tests Ordered: Current medicines are reviewed at length with the patient today.  Concerns regarding medicines are outlined above.   Tests Ordered: No orders of the defined types were placed in this encounter.   Medication Changes: No orders of the defined types were placed in this encounter.   Follow Up:  Virtual Visit or In Person April 2021   Signed, Sanda Klein, MD  04/19/2019 1:47 PM    [facility]

## 2019-04-19 NOTE — Patient Instructions (Signed)
Medication Instructions:  Your physician recommends that you continue on your current medications as directed. Please refer to the Current Medication list given to you today.  If you need a refill on your cardiac medications before your next appointment, please call your pharmacy.   Lab work: Your provider would like for you to return in before your next appointment to have the following labs drawn: Fasting Lipid and Cmet. You do not need an appointment for the lab. Once in our office lobby there is a podium where you can sign in and ring the doorbell to alert [place] that you are here. The lab is open from 8:00 am to 4:30 pm; closed for lunch from 12:45pm-1:45pm.  If you have labs (blood work) drawn today and your tests are completely normal, you will receive your results only by: [place] (if you have MyChart) OR A paper copy in the mail If you have any lab test that is abnormal or we need to change your treatment, we will call you to review the results.  Testing/Procedures: None ordered  Follow-Up: At [facility], you and your health needs are our priority.  As part of our continuing mission to provide you with exceptional heart care, we have created designated Provider Care Teams.  These Care Teams include your primary Cardiologist (physician) and Advanced Practice Providers (APPs -  Physician Assistants and Nurse Practitioners) who all work together to provide you with the care you need, when you need it. You will need a follow up appointment in 9 months.  Please call our office 2 months in advance to schedule this appointment.  You may see Sanda Klein, MD or one of the following Advanced Practice Providers on your designated Care Team: Almyra Deforest, PA-C Fabian Sharp, [place]

## 2019-06-15 ENCOUNTER — Other Ambulatory Visit: Payer: Self-pay

## 2019-06-15 ENCOUNTER — Encounter: Payer: Self-pay | Admitting: Internal Medicine

## 2019-06-15 ENCOUNTER — Ambulatory Visit (INDEPENDENT_AMBULATORY_CARE_PROVIDER_SITE_OTHER): Payer: 59 | Admitting: Internal Medicine

## 2019-06-15 VITALS — Ht 77.0 in | Wt 233.0 lb

## 2019-06-15 DIAGNOSIS — I10 Essential (primary) hypertension: Secondary | ICD-10-CM | POA: Diagnosis not present

## 2019-06-15 DIAGNOSIS — I252 Old myocardial infarction: Secondary | ICD-10-CM

## 2019-06-15 DIAGNOSIS — E785 Hyperlipidemia, unspecified: Secondary | ICD-10-CM | POA: Diagnosis not present

## 2019-06-15 DIAGNOSIS — D1721 Benign lipomatous neoplasm of skin and subcutaneous tissue of right arm: Secondary | ICD-10-CM

## 2019-06-15 NOTE — Progress Notes (Signed)
Virtual Visit via [email or web address]  This visit type was conducted due to national recommendations for restrictions regarding the COVID-19 pandemic (e.g. social distancing).  This format is felt to be most appropriate for this patient at this time.  All issues noted in this document were discussed and addressed.  No physical exam was performed (except for noted visual exam findings with Video Visits).   I connected with@ on 06/15/19 at 10:00 AM EDT by a video enabled telemedicine application and verified that I am speaking with the correct person using two identifiers. Location patient: home Location provider: work or home office Persons participating in the virtual visit: patient, provider  I discussed the limitations, risks, security and privacy concerns of performing an evaluation and management service by telephone and the availability of in person appointments. I also discussed with the patient that there may be a patient responsible charge related to this service. The patient expressed understanding and agreed to proceed.   Reason for visit: 6 month follow up   HPI:  48 yr old male with CAD, history of NSTEMI in March 2020, s/p PTCA /stent of 100% occluded RCA .  On brilinta last seen in April   1) Lipoma right posterior shoulder  Getting larger ,  Wants it removed.  May need to wait until April 2021 if Brilinta has to be suspended.  Gen surg referral discussed.   2) Hypertension: patient checks blood pressure twice weekly at home.  Readings have been for the most part <130/80 at rest . Patient is following a reduced salt diet most days and is taking medications as prescribed .  He is walking daily and playing 2 times per week   3) Overweight :  He has maintained the 16 lb st loss achieved after his March hospitalization .  Home readings 231 lbs .    4) Hyperlipidemia:  LDL reduced to 55 in may. Tolerating statin therapy.   Follow up labs are needed in November.    5) The patient has no signs  or symptoms of COVID 19 infection (fever, cough, sore throat  or shortness of breath beyond what is typical for patient).  Patient denies contact with other persons with the above mentioned symptoms or with anyone confirmed to have COVID 19       ROS: See pertinent positives and negatives per HPI.  Past Medical History:  Diagnosis Date  . Hypertension     Past Surgical History:  Procedure Laterality Date  . CORONARY STENT INTERVENTION N/A 12/29/2018   Procedure: CORONARY STENT INTERVENTION;  Surgeon: Troy Sine, MD;  Location: [facility];  Service: Cardiovascular;  Laterality: N/A;  . cyst removal in back    . LEFT HEART CATH AND CORONARY ANGIOGRAPHY N/A 12/29/2018   Procedure: LEFT HEART CATH AND CORONARY ANGIOGRAPHY;  Surgeon: Troy Sine, MD;  Location: [facility];  Service: Cardiovascular;  Laterality: N/A;    Family History  Problem Relation Age of Onset  . Cancer Maternal Grandmother 58       multiple myeloma  . Heart disease Paternal Uncle        36  . Hypertension Mother   . Stroke Mother   . Drug abuse Neg Hx     SOCIAL HX:  reports that he has quit smoking. His smoking use included cigars. He has never used smokeless tobacco. He reports that he does not drink alcohol or use drugs.   Current Outpatient Medications:  .  aspirin 81  MG chewable tablet, Chew 1 tablet (81 mg total) by mouth daily., Disp: 30 tablet, Rfl: 5 .  atorvastatin (LIPITOR) 80 MG tablet, Take 1 tablet (80 mg total) by mouth daily at 6 PM., Disp: 30 tablet, Rfl: 5 .  carvedilol (COREG) 6.25 MG tablet, Take 1 tablet (6.25 mg total) by mouth 2 (two) times daily with a meal., Disp: 60 tablet, Rfl: 5 .  nitroGLYCERIN (NITROSTAT) 0.4 MG SL tablet, Place 1 tablet (0.4 mg total) under the tongue every 5 (five) minutes as needed for chest pain., Disp: 30 tablet, Rfl: 0 .  ticagrelor (BRILINTA) 90 MG TABS tablet, Take 1 tablet (90 mg total) by mouth 2 (two) times daily. Please contact  cardiology Dr Criotoru's office for prior authorization or refills., Disp: 60 tablet, Rfl: 11  EXAM:  VITALS per patient if applicable:  GENERAL: alert, oriented, appears well and in no acute distress  HEENT: atraumatic, conjunttiva clear, no obvious abnormalities on inspection of external nose and ears  NECK: normal movements of the head and neck  LUNGS: on inspection no signs of respiratory distress, breathing rate appears normal, no obvious gross SOB, gasping or wheezing  CV: no obvious cyanosis  MS: moves all visible extremities without noticeable abnormality  PSYCH/NEURO: pleasant and cooperative, no obvious depression or anxiety, speech and thought processing grossly intact  ASSESSMENT AND PLAN:  Discussed the following assessment and plan:  No diagnosis found.  No problem-specific Assessment & Plan notes found for this encounter.    I discussed the assessment and treatment plan with the patient. The patient was provided an opportunity to ask questions and all were answered. The patient agreed with the plan and demonstrated an understanding of the instructions.   The patient was advised to call back or seek an in-person evaluation if the symptoms worsen or if the condition fails to improve as anticipated.   I provided  25 minutes of non-face-to-face time during this encounter reviewing patient's current problems and past procedures and I  Provided counseling on the above mentioned problems , and coordination  of care .  Crecencio Mc, MD

## 2019-06-16 NOTE — Assessment & Plan Note (Addendum)
Well controlled on carvedilol alone. . Renal function assessment is due ;  no changes today.  Lab Results  Component Value Date   CREATININE 1.30 01/03/2019

## 2019-06-16 NOTE — Assessment & Plan Note (Addendum)
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  S/p stent with resolution to TIMI 0 .  Continue Brilinta for at least one year,  Aspirn,  Statin and beta blocker .  EF normal,  No WMA,  And   LV hypertrophy with Grade I  diastolic dysfunction noted on ECHO  Follow up with cardiology every 6 months

## 2019-06-16 NOTE — Assessment & Plan Note (Signed)
Continue statin given known CAD s/p NSTEMI

## 2019-06-21 ENCOUNTER — Other Ambulatory Visit: Payer: Self-pay | Admitting: Internal Medicine

## 2019-07-31 ENCOUNTER — Other Ambulatory Visit: Payer: Self-pay | Admitting: Internal Medicine

## 2019-10-27 ENCOUNTER — Telehealth: Payer: Self-pay | Admitting: Internal Medicine

## 2019-10-27 NOTE — Telephone Encounter (Signed)
Pt's wife wants to know if he can get the covid vaccine since he is on a blood thinner. Please advise.

## 2019-10-27 NOTE — Telephone Encounter (Signed)
LMTCB

## 2019-11-09 NOTE — Telephone Encounter (Signed)
LMTCB

## 2019-11-09 NOTE — Telephone Encounter (Signed)
That is correct advice

## 2019-11-09 NOTE — Telephone Encounter (Signed)
Pt was advised that he could get the vaccine as long as he has never had an anaphylactic reaction to any other vaccine. Pt stated that he had not.

## 2020-01-04 ENCOUNTER — Encounter: Payer: Self-pay | Admitting: Cardiovascular Disease

## 2020-01-04 ENCOUNTER — Telehealth: Payer: Self-pay | Admitting: *Deleted

## 2020-01-04 ENCOUNTER — Telehealth (INDEPENDENT_AMBULATORY_CARE_PROVIDER_SITE_OTHER): Payer: 59 | Admitting: Cardiovascular Disease

## 2020-01-04 VITALS — BP 132/83 | Ht 76.0 in | Wt 235.0 lb

## 2020-01-04 DIAGNOSIS — E785 Hyperlipidemia, unspecified: Secondary | ICD-10-CM

## 2020-01-04 DIAGNOSIS — I10 Essential (primary) hypertension: Secondary | ICD-10-CM

## 2020-01-04 DIAGNOSIS — I2510 Atherosclerotic heart disease of native coronary artery without angina pectoris: Secondary | ICD-10-CM | POA: Diagnosis not present

## 2020-01-04 NOTE — Progress Notes (Signed)
Virtual Visit via Telephone Note   This visit type was conducted due to national recommendations for restrictions regarding the COVID-19 Pandemic (e.g. social distancing) in an effort to limit this patient's exposure and mitigate transmission in our community.  Due to his co-morbid illnesses, this patient is at least at moderate risk for complications without adequate follow up.  This format is felt to be most appropriate for this patient at this time.  The patient did not have access to video technology/had technical difficulties with video requiring transitioning to audio format only (telephone).  All issues noted in this document were discussed and addressed.  No physical exam could be performed with this format.  Please refer to the patient's chart for his  consent to telehealth for [facility].   The patient was identified using 2 identifiers.  Date:  01/04/2020   ID:  Dylan Haynes, DOB 1971-08-17, MRN [id number]  Patient Location: Home Provider Location: Home  PCP:  Crecencio Mc, MD  Cardiologist:  Sanda Klein, MD  Electrophysiologist:  None   Evaluation Performed:  Follow-Up Visit  Chief Complaint:  F/U CAD  History of Present Illness:    Dylan Haynes is a 49 y.o. male with hypercholesterolemia and essential hypertension who presented with non-STEMI due to occlusion of the distal right coronary artery in April 2020 treated with urgent PCI (2.5 x 12 mm resolute DES).  He also had evidence of mild stenoses in the mid RCA and right posterior lateral ventricular branch, but no significant lesions in the left coronary system.  The echocardiogram performed the day following his infarction showed normal LVEF and wall motion with moderate LVH.  There was evidence of moderate diastolic dysfunction both by LVEDP measurement and echo parameters.  Since then he has done very well without any cardiovascular complaints or events.  She has not had any bleeding problems on dual  antiplatelet therapy.  He denies angina at rest or with activity.  He has not had dyspnea, palpitations, leg edema, claudication, focal neurological events, syncope or other cardiovascular complaints.  His smart phone shows that he walks 10,000 steps a day every day that he is at work.  On some days when he is off, he walks about 5000 steps.  He does not engage in routine physical activity for exercise.  He has gained just a little bit of weight with a BMI of around 28.  His waistline is 38 inches, but he is 6 foot 5 inches tall and has a fairly muscular build.  Most recent LDL cholesterol was 54, with borderline low HDL at 39.  He is compliant with statin therapy.  The patient does not have symptoms concerning for COVID-19 infection (fever, chills, cough, or new shortness of breath).    Past Medical History:  Diagnosis Date  . Hypertension    Past Surgical History:  Procedure Laterality Date  . CORONARY STENT INTERVENTION N/A 12/29/2018   Procedure: CORONARY STENT INTERVENTION;  Surgeon: Troy Sine, MD;  Location: [facility];  Service: Cardiovascular;  Laterality: N/A;  . cyst removal in back    . LEFT HEART CATH AND CORONARY ANGIOGRAPHY N/A 12/29/2018   Procedure: LEFT HEART CATH AND CORONARY ANGIOGRAPHY;  Surgeon: Troy Sine, MD;  Location: [facility];  Service: Cardiovascular;  Laterality: N/A;     Current Meds  Medication Sig  . atorvastatin (LIPITOR) 80 MG tablet TAKE 1 TABLET (80 MG TOTAL) BY MOUTH DAILY AT 6 PM.  .  carvedilol (COREG) 6.25 MG tablet TAKE 1 TABLET (6.25 MG TOTAL) BY MOUTH 2 (TWO) TIMES DAILY WITH A MEAL.  . CVS ASPIRIN ADULT LOW DOSE 81 MG chewable tablet CHEW 1 TABLET (81 MG TOTAL) BY MOUTH DAILY.  . nitroGLYCERIN (NITROSTAT) 0.4 MG SL tablet Place 1 tablet (0.4 mg total) under the tongue every 5 (five) minutes as needed for chest pain.  . [DISCONTINUED] ticagrelor (BRILINTA) 90 MG TABS tablet Take 1 tablet (90 mg total) by mouth 2 (two) times  daily. Please contact cardiology Dr Criotoru's office for prior authorization or refills.     Allergies:   Patient has no known allergies.   Social History   Tobacco Use  . Smoking status: Former Smoker    Types: Cigars  . Smokeless tobacco: Never Used  Substance Use Topics  . Alcohol use: No  . Drug use: No     Family Hx: The patient's family history includes Cancer (age of onset: 14) in his maternal grandmother; Heart disease in his paternal uncle; Hypertension in his mother; Stroke in his mother. There is no history of Drug abuse.  ROS:   Please see the history of present illness.    All other systems reviewed and are negative.   Prior CV studies:   The following studies were reviewed today:  Labs/Other Tests and Data Reviewed:    EKG:  No ECG reviewed.  Recent Labs: 02/04/2019: ALT 39   Recent Lipid Panel Lab Results  Component Value Date/Time   CHOL 103 02/04/2019 09:25 AM   TRIG 59 02/04/2019 09:25 AM   HDL 36 (L) 02/04/2019 09:25 AM   CHOLHDL 2.9 02/04/2019 09:25 AM   CHOLHDL 4.7 12/29/2018 04:47 AM   LDLCALC 55 02/04/2019 09:25 AM   LDLDIRECT 188.4 12/29/2012 11:58 AM    Wt Readings from Last 3 Encounters:  01/04/20 235 lb (106.6 kg)  06/15/19 233 lb (105.7 kg)  04/19/19 233 lb (105.7 kg)     Objective:    Vital Signs:  BP 132/83   Ht 6\' 4"  (1.93 m)   Wt 235 lb (106.6 kg)   BMI 28.61 kg/m    VITAL SIGNS:  reviewed Unable to examine  ASSESSMENT & PLAN:    1. CAD: Asymptomatic.  12 months have passed since acute coronary syndrome/stent.  Stop Brilinta.  Continue aspirin 81 mg daily, statin, beta-blocker. 2. HTN: Blood pressure very close to target.  No changes in medications made today.  With additional weight loss his blood pressure should be fully in the desirable range. 3. HLP: Need to get an updated lipid profile.  LDL was excellent, but HDL is borderline low.  Discussed the importance of maintaining a healthy weight, avoiding excessive  carbohydrates in his diet and frequent exercise to improve the HDL and long-term prognosis.  Reminded him of his self declared target weight of 220 pounds.  I would recommend a waistline of 36 inches or less (he is a very tall gentleman).  COVID-19 Education: The signs and symptoms of COVID-19 were discussed with the patient and how to seek care for testing (follow up with PCP or arrange E-visit).  The importance of social distancing was discussed today.  Time:   Today, I have spent 19 minutes with the patient with telehealth technology discussing the above problems.     Medication Adjustments/Labs and Tests Ordered: Current medicines are reviewed at length with the patient today.  Concerns regarding medicines are outlined above.   Tests Ordered: Orders Placed This Encounter  Procedures  . Comprehensive metabolic panel  . Lipid panel    Medication Changes: No orders of the defined types were placed in this encounter.   Follow Up:  In Person 1 year  Signed, Sanda Klein, MD  01/04/2020 10:01 AM    [place]

## 2020-01-04 NOTE — Telephone Encounter (Signed)
The patient has been called about the virtual appointment today with Dr. Sallyanne Kuster. Instructions provided. The AVS and lab orders will be mailed.

## 2020-01-04 NOTE — Telephone Encounter (Signed)
  Patient Consent for Virtual Visit         Dylan Haynes has provided verbal consent on 01/04/2020 for a virtual visit (video or telephone).   CONSENT FOR VIRTUAL VISIT FOR:  Dylan Haynes  By participating in this virtual visit I agree to the following:  I hereby voluntarily request, consent and authorize [facility] and its employed or contracted physicians, physician assistants, nurse practitioners or other licensed health care professionals (the Practitioner), to provide me with telemedicine health care services (the "Services") as deemed necessary by the treating Practitioner. I acknowledge and consent to receive the Services by the Practitioner via telemedicine. I understand that the telemedicine visit will involve communicating with the Practitioner through live audiovisual communication technology and the disclosure of certain medical information by electronic transmission. I acknowledge that I have been given the opportunity to request an in-person assessment or other available alternative prior to the telemedicine visit and am voluntarily participating in the telemedicine visit.  I understand that I have the right to withhold or withdraw my consent to the use of telemedicine in the course of my care at any time, without affecting my right to future care or treatment, and that the Practitioner or I may terminate the telemedicine visit at any time. I understand that I have the right to inspect all information obtained and/or recorded in the course of the telemedicine visit and may receive copies of available information for a reasonable fee.  I understand that some of the potential risks of receiving the Services via telemedicine include:  Marland Kitchen Delay or interruption in medical evaluation due to technological equipment failure or disruption; . Information transmitted may not be sufficient (e.g. poor resolution of images) to allow for appropriate medical decision making by the  Practitioner; and/or  . In rare instances, security protocols could fail, causing a breach of personal health information.  Furthermore, I acknowledge that it is my responsibility to provide information about my medical history, conditions and care that is complete and accurate to the best of my ability. I acknowledge that Practitioner's advice, recommendations, and/or decision may be based on factors not within their control, such as incomplete or inaccurate data provided by me or distortions of diagnostic images or specimens that may result from electronic transmissions. I understand that the practice of medicine is not an exact science and that Practitioner makes no warranties or guarantees regarding treatment outcomes. I acknowledge that a copy of this consent can be made available to me via my patient portal ([facility]), or I can request a printed copy by calling the office of [facility].    I understand that my insurance will be billed for this visit.   I have read or had this consent read to me. . I understand the contents of this consent, which adequately explains the benefits and risks of the Services being provided via telemedicine.  . I have been provided ample opportunity to ask questions regarding this consent and the Services and have had my questions answered to my satisfaction. . I give my informed consent for the services to be provided through the use of telemedicine in my medical care

## 2020-01-04 NOTE — Patient Instructions (Signed)
Medication Instructions:  STOP the Brilinta  *If you need a refill on your cardiac medications before your next appointment, please call your pharmacy*   Lab Work: Your provider would like for you to return in a few weeks to have the following labs drawn: fasting lipid and CMET. You do not need an appointment for the lab. Once in our office lobby there is a podium where you can sign in and ring the doorbell to alert [place] that you are here. The lab is open from 8:00 am to 4:30 pm; closed for lunch from 12:45pm-1:45pm.  If you have labs (blood work) drawn today and your tests are completely normal, you will receive your results only by: Marland Kitchen MyChart Message (if you have MyChart) OR . A paper copy in the mail If you have any lab test that is abnormal or we need to change your treatment, we will call you to review the results.   Testing/Procedures: None ordered   Follow-Up: At [facility], you and your health needs are our priority.  As part of our continuing mission to provide you with exceptional heart care, we have created designated Provider Care Teams.  These Care Teams include your primary Cardiologist (physician) and Advanced Practice Providers (APPs -  Physician Assistants and Nurse Practitioners) who all work together to provide you with the care you need, when you need it.  We recommend signing up for the patient portal called "MyChart".  Sign up information is provided on this After Visit Summary.  MyChart is used to connect with patients for Virtual Visits (Telemedicine).  Patients are able to view lab/test results, encounter notes, upcoming appointments, etc.  Non-urgent messages can be sent to your provider as well.   To learn more about what you can do with MyChart, go to [email or web address].    Your next appointment:   12 month(s)  The format for your next appointment:   In Person  Provider:   Sanda Klein, MD

## 2020-01-12 ENCOUNTER — Telehealth: Payer: Self-pay | Admitting: Internal Medicine

## 2020-01-12 NOTE — Telephone Encounter (Signed)
Pt's wife called and states that pt received second Moderna vaccine yesterday and has been vomiting since 5am this morning. She was going to give him Gatorade but states that pt has had heart attack about a year ago and is watching sodium intake. She would like a call back for advice on what to give him or what to look out for? Please advise.

## 2020-01-12 NOTE — Telephone Encounter (Signed)
gatorade (regular,  Not diet) is fine. And can be alternated with water

## 2020-01-12 NOTE — Telephone Encounter (Signed)
Patient has been informed.

## 2020-01-12 NOTE — Telephone Encounter (Signed)
Spoken to patient. He is feeling better. Did notify him that 23% do experience the same sx of nausea and vomiting after receiving the [facility] vaccine. Per protocol I have given him the number for [employer] to contact.

## 2020-02-03 ENCOUNTER — Other Ambulatory Visit: Payer: Self-pay | Admitting: Internal Medicine

## 2020-03-27 ENCOUNTER — Other Ambulatory Visit: Payer: Self-pay

## 2020-03-27 ENCOUNTER — Ambulatory Visit: Payer: 59 | Admitting: Internal Medicine

## 2020-03-27 ENCOUNTER — Encounter: Payer: Self-pay | Admitting: Internal Medicine

## 2020-03-27 VITALS — BP 150/106 | HR 65 | Temp 97.7°F | Ht 75.98 in | Wt 249.0 lb

## 2020-03-27 DIAGNOSIS — I10 Essential (primary) hypertension: Secondary | ICD-10-CM | POA: Diagnosis not present

## 2020-03-27 MED ORDER — LOSARTAN POTASSIUM-HCTZ 50-12.5 MG PO TABS: 0.5000 | ORAL_TABLET | Freq: Every day | ORAL | 0 refills | Status: DC

## 2020-03-27 NOTE — Progress Notes (Signed)
Chief Complaint  Patient presents with  . Hypertension    Pt c/o high blood pressure. 178/102 when taken at home   F/u with wife monique  1. HTN elevated at home on coreg 6.25 mg bid in the past was on lis-hctz and losartan-hctz and telmisartan-hctz mom with h/o HTN and dads side of family. 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 No sx's with HTN. He does have PMH of NSTEMI with stent x 1  BP 178/102 at home they are consider wrist BP cuff  Review of Systems  Constitutional: Negative for weight loss.  HENT: Negative for hearing loss.   Eyes: Negative for blurred vision.  Respiratory: Negative for shortness of breath.   Cardiovascular: Negative for chest pain.  Gastrointestinal: Negative for abdominal pain.  Skin: Negative for rash.  Neurological: Negative for headaches.  Psychiatric/Behavioral: Negative for depression.   Past Medical History:  Diagnosis Date  . Hypertension    Past Surgical History:  Procedure Laterality Date  . CORONARY STENT INTERVENTION N/A 12/29/2018   Procedure: CORONARY STENT INTERVENTION;  Surgeon: Troy Sine, MD;  Location: [facility];  Service: Cardiovascular;  Laterality: N/A;  . cyst removal in back    . LEFT HEART CATH AND CORONARY ANGIOGRAPHY N/A 12/29/2018   Procedure: LEFT HEART CATH AND CORONARY ANGIOGRAPHY;  Surgeon: Troy Sine, MD;  Location: [facility];  Service: Cardiovascular;  Laterality: N/A;   Family History  Problem Relation Age of Onset  . Cancer Maternal Grandmother 65       multiple myeloma  . Heart disease Paternal Uncle        54  . Hypertension Mother   . Stroke Mother   . Drug abuse Neg Hx    Social History   Socioeconomic History  . Marital status: Married    Spouse name: Not on file  . Number of children: Not on file  . Years of education: Not on file  . Highest education level: Not on file  Occupational History  .  Occupation: Immunologist  Tobacco Use  . Smoking status: Former Smoker    Types: Cigars  . Smokeless tobacco: Never Used  Substance and Sexual Activity  . Alcohol use: No  . Drug use: No  . Sexual activity: Not on file  Other Topics Concern  . Not on file  Social History Narrative  . Not on file   Social Determinants of Health   Financial Resource Strain:   . Difficulty of Paying Living Expenses:   Food Insecurity:   . Worried About Charity fundraiser in the Last Year:   . Arboriculturist in the Last Year:   Transportation Needs:   . Film/video editor (Medical):   Marland Kitchen Lack of Transportation (Non-Medical):   Physical Activity:   . Days of Exercise per Week:   . Minutes of Exercise per Session:   Stress:   . Feeling of Stress :   Social Connections:   . Frequency of Communication with Friends and Family:   . Frequency of Social Gatherings with Friends and Family:   . Attends Religious Services:   . Active Member of Clubs or Organizations:   . Attends Archivist Meetings:   Marland Kitchen Marital Status:   Intimate Partner Violence:   . Fear of Current or Ex-Partner:   . Emotionally Abused:   Marland Kitchen Physically Abused:   .  Sexually Abused:    Current Meds  Medication Sig  . atorvastatin (LIPITOR) 80 MG tablet TAKE 1 TABLET (80 MG TOTAL) BY MOUTH DAILY AT 6 PM.  . carvedilol (COREG) 6.25 MG tablet TAKE 1 TABLET (6.25 MG TOTAL) BY MOUTH 2 (TWO) TIMES DAILY WITH A MEAL.  . [facility] ASPIRIN ADULT LOW DOSE 81 MG chewable tablet CHEW 1 TABLET (81 MG TOTAL) BY MOUTH DAILY.   No Known Allergies No results found for this or any previous visit (from the past 2160 hour(s)). Objective  Body mass index is 30.32 kg/m. Wt Readings from Last 3 Encounters:  03/27/20 249 lb (112.9 kg)  01/04/20 235 lb (106.6 kg)  06/15/19 233 lb (105.7 kg)   Temp Readings from Last 3 Encounters:  03/27/20 97.7 F (36.5 C)  12/30/18 98 F (36.7 C) (Oral)  12/08/18 98.1 F (36.7 C) (Oral)   BP  Readings from Last 3 Encounters:  03/27/20 (!) 150/106  01/04/20 132/83  04/19/19 128/83   Pulse Readings from Last 3 Encounters:  03/27/20 65  01/17/19 86  12/30/18 85    Physical Exam Vitals and nursing note reviewed.  Constitutional:      Appearance: Normal appearance. He is well-developed and well-groomed.  HENT:     Head: Normocephalic and atraumatic.  Eyes:     Conjunctiva/sclera: Conjunctivae normal.     Pupils: Pupils are equal, round, and reactive to light.  Cardiovascular:     Rate and Rhythm: Normal rate and regular rhythm.     Heart sounds: Normal heart sounds. No murmur heard.   Pulmonary:     Effort: Pulmonary effort is normal.     Breath sounds: Normal breath sounds.  Skin:    General: Skin is warm and dry.  Neurological:     General: No focal deficit present.     Mental Status: He is alert and oriented to person, place, and time. Mental status is at baseline.     Gait: Gait normal.  Psychiatric:        Attention and Perception: Attention and perception normal.        Mood and Affect: Mood and affect normal.        Speech: Speech normal.        Behavior: Behavior normal. Behavior is cooperative.        Thought Content: Thought content normal.        Cognition and Memory: Cognition and memory normal.        Judgment: Judgment normal.     Assessment  Plan  Essential hypertension - Plan: losartan-hydrochlorothiazide (HYZAAR) 50-12.5 MG tablet -will have pt take 1/2 pill of above with coreg  Given BP log  rec upper arm cuff  F/u PCP as sch  Cards f/u 06/2020 due  Provider: Dr. Olivia Mackie McLean-Scocuzza-[facility]

## 2020-03-27 NOTE — Patient Instructions (Addendum)
Goal blood pressure <130/<80   Take 1/2 pill losartan-hctz in tha am     Seborrheic Keratosis-on your left lower back   A seborrheic keratosis is a common, noncancerous (benign) skin growth. These growths are velvety, waxy, rough, tan, brown, or black spots that appear on the skin. These skin growths can be flat or raised, and scaly. What are the causes? The cause of this condition is not known. What increases the risk? You are more likely to develop this condition if you:  Have a family history of seborrheic keratosis.  Are 50 or older.  Are pregnant.  Have had estrogen replacement therapy. What are the signs or symptoms? Symptoms of this condition include growths on the face, chest, shoulders, back, or other areas. These growths:  Are usually painless, but may become irritated and itchy.  Can be yellow, brown, black, or other colors.  Are slightly raised or have a flat surface.  Are sometimes rough or wart-like in texture.  Are often velvety or waxy on the surface.  Are round or oval-shaped.  Often occur in groups, but may occur as a single growth. How is this diagnosed? This condition is diagnosed with a medical history and physical exam.  A sample of the growth may be tested (skin biopsy).  You may need to see a skin specialist (dermatologist). How is this treated? Treatment is not usually needed for this condition, unless the growths are irritated or bleed often.  You may also choose to have the growths removed if you do not like their appearance. ? Most commonly, these growths are treated with a procedure in which liquid nitrogen is applied to "freeze" off the growth (cryosurgery). ? They may also be burned off with electricity (electrocautery) or removed by scraping (curettage). Follow these instructions at home:  Watch your growth for any changes.  Keep all follow-up visits as told by your health care provider. This is important.  Do not scratch or pick  at the growth or growths. This can cause them to become irritated or infected. Contact a health care provider if:  You suddenly have many new growths.  Your growth bleeds, itches, or hurts.  Your growth suddenly becomes larger or changes color. Summary  A seborrheic keratosis is a common, noncancerous (benign) skin growth.  Treatment is not usually needed for this condition, unless the growths are irritated or bleed often.  Watch your growth for any changes.  Contact a health care provider if you suddenly have many new growths or your growth suddenly becomes larger or changes color.  Keep all follow-up visits as told by your health care provider. This is important. This information is not intended to replace advice given to you by your health care provider. Make sure you discuss any questions you have with your health care provider. Document Revised: 01/28/2018 Document Reviewed: 01/28/2018 Elsevier Patient Education  [place] DASH stands for "Dietary Approaches to Stop Hypertension." The DASH eating plan is a healthy eating plan that has been shown to reduce high blood pressure (hypertension). It may also reduce your risk for type 2 diabetes, heart disease, and stroke. The DASH eating plan may also help with weight loss. What are tips for following this plan?  General guidelines  Avoid eating more than 2,300 mg (milligrams) of salt (sodium) a day. If you have hypertension, you may need to reduce your sodium intake to 1,500 mg a day.  Limit alcohol intake to no more than  1 drink a day for nonpregnant women and 2 drinks a day for men. One drink equals 12 oz of beer, 5 oz of wine, or 1 oz of hard liquor.  Work with your health care provider to maintain a healthy body weight or to lose weight. Ask what an ideal weight is for you.  Get at least 30 minutes of exercise that causes your heart to beat faster (aerobic exercise) most days of the week.  Activities may include walking, swimming, or biking.  Work with your health care provider or diet and nutrition specialist (dietitian) to adjust your eating plan to your individual calorie needs. Reading food labels   Check food labels for the amount of sodium per serving. Choose foods with less than 5 percent of the Daily Value of sodium. Generally, foods with less than 300 mg of sodium per serving fit into this eating plan.  To find whole grains, look for the word "whole" as the first word in the ingredient list. Shopping  Buy products labeled as "low-sodium" or "no salt added."  Buy fresh foods. Avoid canned foods and premade or frozen meals. Cooking  Avoid adding salt when cooking. Use salt-free seasonings or herbs instead of table salt or sea salt. Check with your health care provider or pharmacist before using salt substitutes.  Do not fry foods. Cook foods using healthy methods such as baking, boiling, grilling, and broiling instead.  Cook with heart-healthy oils, such as olive, canola, soybean, or sunflower oil. Meal planning  Eat a balanced diet that includes: ? 5 or more servings of fruits and vegetables each day. At each meal, try to fill half of your plate with fruits and vegetables. ? Up to 6-8 servings of whole grains each day. ? Less than 6 oz of lean meat, poultry, or fish each day. A 3-oz serving of meat is about the same size as a deck of cards. One egg equals 1 oz. ? 2 servings of low-fat dairy each day. ? A serving of nuts, seeds, or beans 5 times each week. ? Heart-healthy fats. Healthy fats called Omega-3 fatty acids are found in foods such as flaxseeds and coldwater fish, like sardines, salmon, and mackerel.  Limit how much you eat of the following: ? Canned or prepackaged foods. ? Food that is high in trans fat, such as fried foods. ? Food that is high in saturated fat, such as fatty meat. ? Sweets, desserts, sugary drinks, and other foods with added  sugar. ? Full-fat dairy products.  Do not salt foods before eating.  Try to eat at least 2 vegetarian meals each week.  Eat more home-cooked food and less restaurant, buffet, and fast food.  When eating at a restaurant, ask that your food be prepared with less salt or no salt, if possible. What foods are recommended? The items listed may not be a complete list. Talk with your dietitian about what dietary choices are best for you. Grains Whole-grain or whole-wheat bread. Whole-grain or whole-wheat pasta. Brown rice. [place]. Bulgur. Whole-grain and low-sodium cereals. Pita bread. Low-fat, low-sodium crackers. Whole-wheat flour tortillas. Vegetables Fresh or frozen vegetables (raw, steamed, roasted, or grilled). Low-sodium or reduced-sodium tomato and vegetable juice. Low-sodium or reduced-sodium tomato sauce and tomato paste. Low-sodium or reduced-sodium canned vegetables. Fruits All fresh, dried, or frozen fruit. Canned fruit in natural juice (without added sugar). Meat and other protein foods Skinless chicken or [place]. Ground chicken or [place]. Pork with fat trimmed off. Fish and seafood. Egg whites.  Dried beans, peas, or lentils. Unsalted nuts, nut butters, and seeds. Unsalted canned beans. Lean cuts of beef with fat trimmed off. Low-sodium, lean deli meat. Dairy Low-fat (1%) or fat-free (skim) milk. Fat-free, low-fat, or reduced-fat cheeses. Nonfat, low-sodium ricotta or cottage cheese. Low-fat or nonfat yogurt. Low-fat, low-sodium cheese. Fats and oils Soft margarine without trans fats. Vegetable oil. Low-fat, reduced-fat, or light mayonnaise and salad dressings (reduced-sodium). Canola, safflower, olive, soybean, and sunflower oils. Avocado. Seasoning and other foods Herbs. Spices. Seasoning mixes without salt. Unsalted popcorn and pretzels. Fat-free sweets. What foods are not recommended? The items listed may not be a complete list. Talk with your dietitian about what  dietary choices are best for you. Grains Baked goods made with fat, such as croissants, muffins, or some breads. Dry pasta or rice meal packs. Vegetables Creamed or fried vegetables. Vegetables in a cheese sauce. Regular canned vegetables (not low-sodium or reduced-sodium). Regular canned tomato sauce and paste (not low-sodium or reduced-sodium). Regular tomato and vegetable juice (not low-sodium or reduced-sodium). Angie Fava. Olives. Fruits Canned fruit in a light or heavy syrup. Fried fruit. Fruit in cream or butter sauce. Meat and other protein foods Fatty cuts of meat. Ribs. Fried meat. Berniece Salines. Sausage. Bologna and other processed lunch meats. Salami. Fatback. Hotdogs. Bratwurst. Salted nuts and seeds. Canned beans with added salt. Canned or smoked fish. Whole eggs or egg yolks. Chicken or [place] with skin. Dairy Whole or 2% milk, cream, and half-and-half. Whole or full-fat cream cheese. Whole-fat or sweetened yogurt. Full-fat cheese. Nondairy creamers. Whipped toppings. Processed cheese and cheese spreads. Fats and oils Butter. Stick margarine. Lard. Shortening. Ghee. Bacon fat. Tropical oils, such as coconut, palm kernel, or palm oil. Seasoning and other foods Salted popcorn and pretzels. Onion salt, garlic salt, seasoned salt, table salt, and sea salt. Worcestershire sauce. Tartar sauce. Barbecue sauce. Teriyaki sauce. Soy sauce, including reduced-sodium. Steak sauce. Canned and packaged gravies. Fish sauce. Oyster sauce. Cocktail sauce. Horseradish that you find on the shelf. Ketchup. Mustard. Meat flavorings and tenderizers. Bouillon cubes. Hot sauce and Tabasco sauce. Premade or packaged marinades. Premade or packaged taco seasonings. Relishes. Regular salad dressings. Where to find more information:  National Heart, Lung, and [place]: https://wilson-eaton.com/  American Heart Association: www.heart.org Summary  The DASH eating plan is a healthy eating plan that has been shown to reduce  high blood pressure (hypertension). It may also reduce your risk for type 2 diabetes, heart disease, and stroke.  With the DASH eating plan, you should limit salt (sodium) intake to 2,300 mg a day. If you have hypertension, you may need to reduce your sodium intake to 1,500 mg a day.  When on the DASH eating plan, aim to eat more fresh fruits and vegetables, whole grains, lean proteins, low-fat dairy, and heart-healthy fats.  Work with your health care provider or diet and nutrition specialist (dietitian) to adjust your eating plan to your individual calorie needs. This information is not intended to replace advice given to you by your health care provider. Make sure you discuss any questions you have with your health care provider. Document Revised: 08/28/2017 Document Reviewed: 09/08/2016 Elsevier Patient Education  2020 [employer].  Hypokalemia Hypokalemia means that the amount of potassium in the blood is lower than normal. Potassium is a chemical (electrolyte) that helps regulate the amount of fluid in the body. It also stimulates muscle tightening (contraction) and helps nerves work properly. Normally, most of the body's potassium is inside cells, and only a very small  amount is in the blood. Because the amount in the blood is so small, minor changes to potassium levels in the blood can be life-threatening. What are the causes? This condition may be caused by:  Antibiotic medicine.  Diarrhea or vomiting. Taking too much of a medicine that helps you have a bowel movement (laxative) can cause diarrhea and lead to hypokalemia.  Chronic kidney disease (CKD).  Medicines that help the body get rid of excess fluid (diuretics).  Eating disorders, such as bulimia.  Low magnesium levels in the body.  Sweating a lot. What are the signs or symptoms? Symptoms of this condition include:  Weakness.  Constipation.  Fatigue.  Muscle cramps.  Mental confusion.  Skipped heartbeats or  irregular heartbeat (palpitations).  Tingling or numbness. How is this diagnosed? This condition is diagnosed with a blood test. How is this treated? This condition may be treated by:  Taking potassium supplements by mouth.  Adjusting the medicines that you take.  Eating more foods that contain a lot of potassium. If your potassium level is very low, you may need to get potassium through an IV and be monitored in the hospital. Follow these instructions at home:   Take over-the-counter and prescription medicines only as told by your health care provider. This includes vitamins and supplements.  Eat a healthy diet. A healthy diet includes fresh fruits and vegetables, whole grains, healthy fats, and lean proteins.  If instructed, eat more foods that contain a lot of potassium. This includes: ? Nuts, such as peanuts and pistachios. ? Seeds, such as sunflower seeds and pumpkin seeds. ? Peas, lentils, and lima beans. ? Whole grain and bran cereals and breads. ? Fresh fruits and vegetables, such as apricots, avocado, bananas, cantaloupe, kiwi, oranges, tomatoes, asparagus, and potatoes. ? Orange juice. ? Tomato juice. ? Red meats. ? Yogurt.  Keep all follow-up visits as told by your health care provider. This is important. Contact a health care provider if you:  Have weakness that gets worse.  Feel your heart pounding or racing.  Vomit.  Have diarrhea.  Have diabetes (diabetes mellitus) and you have trouble keeping your blood sugar (glucose) in your target range. Get help right away if you:  Have chest pain.  Have shortness of breath.  Have vomiting or diarrhea that lasts for more than 2 days.  Faint. Summary  Hypokalemia means that the amount of potassium in the blood is lower than normal.  This condition is diagnosed with a blood test.  Hypokalemia may be treated by taking potassium supplements, adjusting the medicines that you take, or eating more foods that are  high in potassium.  If your potassium level is very low, you may need to get potassium through an IV and be monitored in the hospital. This information is not intended to replace advice given to you by your health care provider. Make sure you discuss any questions you have with your health care provider. Document Revised: 04/28/2018 Document Reviewed: 04/28/2018 Elsevier Patient Education  [place].

## 2020-03-28 ENCOUNTER — Ambulatory Visit: Payer: 59 | Admitting: Physician Assistant

## 2020-03-28 ENCOUNTER — Telehealth: Payer: Self-pay | Admitting: Cardiovascular Disease

## 2020-03-28 NOTE — Telephone Encounter (Signed)
Pt c/o BP issue: STAT if pt c/o blurred vision, one-sided weakness or slurred speech  1. What are your last 5 BP readings? 178/102, 160/100's, 140's/90's  2. Are you having any other symptoms (ex. Dizziness, headache, blurred vision, passed out)? No symptoms  3. What is your BP issue? Patient's wife states that the patient randomly checked his BP the other day and it was 178/102. She states it stayed consistently around the 160/100's. She had him relax and calm down and check again and it stayed around the 140's/90's. She states that he had a heart attack last year that had no symptoms leading up so she is very concerned. She states that the patient is not active but does not eat fried foods or red meat. He saw his PCP yesterday and says the patient is to start Losartan and a fluid pill today. He followed up virtually with Dr. Sallyanne Kuster in April but feels like he may need to come in and be seen. She wants to know what Dr. Sallyanne Kuster or his nurse recommend as far as scheduling an appointment.

## 2020-03-28 NOTE — Telephone Encounter (Signed)
Spoke with the patients wife. Patient's wife states that the patient randomly checked his BP the other day and it was 178/102. She states it stayed consistently around the 160/100's. She had him relax and calm down and check again and it stayed around the 140's/90's. She states that he had a heart attack last year that had no symptoms leading up so she is very concerned. She states that the patient is not active but does not eat fried foods or red meat. He saw his PCP yesterday and says the patient is to start Losartan and a fluid pill today. He followed up virtually with Dr. Sallyanne Kuster in April but feels like he may need to come in and be seen. Offered appointment this afternoon but the patient is unable to make it. Pt is not symptomatic and feels relatively "normal". Dr. Loletha Grayer can not see patient until August. Pt prefers to be seen sooner. Will route to scheduling to follow up.

## 2020-03-29 ENCOUNTER — Ambulatory Visit: Payer: 59 | Admitting: Internal Medicine

## 2020-04-10 NOTE — Telephone Encounter (Signed)
Called patient to schedule visit with Dr. Loletha Grayer. Patient picked up the phone then hung up. Called back, phone went to vm after ringing a few times. Left vm to call back to schedule appt.

## 2020-04-12 ENCOUNTER — Other Ambulatory Visit: Payer: Self-pay | Admitting: Internal Medicine

## 2020-04-12 ENCOUNTER — Ambulatory Visit (INDEPENDENT_AMBULATORY_CARE_PROVIDER_SITE_OTHER): Payer: 59 | Admitting: Internal Medicine

## 2020-04-12 ENCOUNTER — Other Ambulatory Visit: Payer: Self-pay

## 2020-04-12 ENCOUNTER — Encounter: Payer: Self-pay | Admitting: Internal Medicine

## 2020-04-12 VITALS — BP 118/88 | HR 75 | Temp 98.0°F | Resp 14 | Ht 75.0 in | Wt 244.0 lb

## 2020-04-12 DIAGNOSIS — Z125 Encounter for screening for malignant neoplasm of prostate: Secondary | ICD-10-CM | POA: Diagnosis not present

## 2020-04-12 DIAGNOSIS — I10 Essential (primary) hypertension: Secondary | ICD-10-CM | POA: Diagnosis not present

## 2020-04-12 DIAGNOSIS — E663 Overweight: Secondary | ICD-10-CM

## 2020-04-12 DIAGNOSIS — Z0000 Encounter for general adult medical examination without abnormal findings: Secondary | ICD-10-CM | POA: Diagnosis not present

## 2020-04-12 DIAGNOSIS — Z1211 Encounter for screening for malignant neoplasm of colon: Secondary | ICD-10-CM | POA: Diagnosis not present

## 2020-04-12 DIAGNOSIS — E785 Hyperlipidemia, unspecified: Secondary | ICD-10-CM

## 2020-04-12 DIAGNOSIS — D171 Benign lipomatous neoplasm of skin and subcutaneous tissue of trunk: Secondary | ICD-10-CM | POA: Diagnosis not present

## 2020-04-12 DIAGNOSIS — D1721 Benign lipomatous neoplasm of skin and subcutaneous tissue of right arm: Secondary | ICD-10-CM | POA: Insufficient documentation

## 2020-04-12 LAB — MICROALBUMIN / CREATININE URINE RATIO
Creatinine,U: 319.1 mg/dL
Microalb Creat Ratio: 0.4 mg/g (ref 0.0–30.0)
Microalb, Ur: 1.2 mg/dL (ref 0.0–1.9)

## 2020-04-12 LAB — PSA: PSA: 0.93 ng/mL (ref 0.10–4.00)

## 2020-04-12 NOTE — Assessment & Plan Note (Signed)
Surgery was postponed last year due to use of anti platelet agent.  Referral now made to [facility]

## 2020-04-12 NOTE — Assessment & Plan Note (Signed)
Continue statin given known CAD s/p NSTEMI. Fasting lipids needed

## 2020-04-12 NOTE — Assessment & Plan Note (Signed)
He is not overweight based on exam , only by BMI. However regular exercise encouraged.

## 2020-04-12 NOTE — Assessment & Plan Note (Signed)
Improved with addition of losartan by [place] on June 29. Repeat assessment of lytes and cr needed.  Goal 130/80 or less

## 2020-04-12 NOTE — Assessment & Plan Note (Signed)

## 2020-04-12 NOTE — Progress Notes (Signed)
Patient ID: Dylan Haynes, male    DOB: 03-26-1971  Age: 49 y.o. MRN: [id number]  The patient is here for annual Medicare wellness examination and management of other chronic and acute problems.   The risk factors are reflected in the social history.  The roster of all physicians providing medical care to patient - is listed in the Snapshot section of the chart.  Activities of daily living:  The patient is 100% independent in all ADLs: dressing, toileting, feeding as well as independent mobility  Home safety : The patient has smoke detectors in the home. They wear seatbelts.  There are no firearms at home. There is no violence in the home.   There is no risks for hepatitis, STDs or HIV. There is no   history of blood transfusion. They have no travel history to infectious disease endemic areas of the world.  The patient has seen their dentist in the last six month. They have seen their eye doctor in the last year. They admit to slight hearing difficulty with regard to whispered voices and some television programs.  They have deferred audiologic testing in the last year.  They do not  have excessive sun exposure. Discussed the need for sun protection: hats, long sleeves and use of sunscreen if there is significant sun exposure.   Diet: the importance of a healthy diet is discussed. They do have a healthy diet.  The benefits of regular aerobic exercise were discussed. he walks 4 times per week ,  20 minutes.   Depression screen: there are no signs or vegative symptoms of depression- irritability, change in appetite, anhedonia, sadness/tearfullness.  Lab Results  Component Value Date   PSA 0.93 04/12/2020   PSA 1.51 09/18/2016   PSA 1.52 01/22/2015     The following portions of the patient's history were reviewed and updated as appropriate: allergies, current medications, past family history, past medical history,  past surgical history, past social history  and problem list.  Visual  acuity was not assessed per patient preference since she has regular follow up with her ophthalmologist. Hearing and body mass index were assessed and reviewed.   During the course of the visit the patient was educated and counseled about appropriate screening and preventive services including : fall prevention , diabetes screening, nutrition counseling, colorectal cancer screening, and recommended immunizations.    CC: The primary encounter diagnosis was Prostate cancer screening. Diagnoses of Essential hypertension, Lipoma of back, Colon cancer screening, Overweight (BMI 25.0-29.9), Encounter for preventive health examination, Hyperlipidemia LDL goal <70, and Lipoma of right shoulder were also pertinent to this visit.  1) treated for hypertension 2 weeks ago by Dylan Haynes  Losartan added.  Home readings reviewed.  Wants lipoma removed from right shoulder,  Growing larger.  History Dylan Haynes has a past medical history of Hypertension.   He has a past surgical history that includes cyst removal in back; LEFT HEART CATH AND CORONARY ANGIOGRAPHY (N/A, 12/29/2018); and CORONARY STENT INTERVENTION (N/A, 12/29/2018).   His family history includes Cancer (age of onset: 16) in his maternal grandmother; Heart disease in his paternal uncle; Hypertension in his mother; Stroke in his mother.He reports that he has quit smoking. His smoking use included cigars. He has never used smokeless tobacco. He reports that he does not drink alcohol and does not use drugs.  Outpatient Medications Prior to Visit  Medication Sig Dispense Refill  . atorvastatin (LIPITOR) 80 MG tablet TAKE 1 TABLET (80 MG TOTAL) BY MOUTH DAILY AT  6 PM. 90 tablet 3  . carvedilol (COREG) 6.25 MG tablet TAKE 1 TABLET (6.25 MG TOTAL) BY MOUTH 2 (TWO) TIMES DAILY WITH A MEAL. 180 tablet 3  . [facility] ASPIRIN ADULT LOW DOSE 81 MG chewable tablet CHEW 1 TABLET (81 MG TOTAL) BY MOUTH DAILY. 90 tablet 1  . losartan-hydrochlorothiazide (HYZAAR) 50-12.5 MG tablet Take  0.5 tablets by mouth daily. In am. Please cut the pill for the patient 90 tablet 0   No facility-administered medications prior to visit.    Review of Systems  Objective:  BP 118/88 (BP Location: Left Arm, Patient Position: Sitting, Cuff Size: Large)   Pulse 75   Temp 98 F (36.7 C) (Oral)   Resp 14   Ht 6\' 3"  (1.905 m)   Wt 244 lb (110.7 kg)   SpO2 96%   BMI 30.50 kg/m   Physical Exam    Assessment & Plan:   Problem List Items Addressed This Visit      Unprioritized   Hypertension    Improved with addition of losartan by Dylan Haynes on June 29. Repeat assessment of lytes and cr needed.  Goal 130/80 or less      Relevant Orders   Microalbumin / creatinine urine ratio (Completed)   Overweight (BMI 25.0-29.9)    He is not overweight based on exam , only by BMI. However regular exercise encouraged.       Encounter for preventive health examination    age appropriate education and counseling updated, referrals for preventative services and immunizations addressed, dietary and smoking counseling addressed, most recent labs reviewed.  I have personally reviewed and have noted:  1) the patient's medical and social history 2) The pt's use of alcohol, tobacco, and illicit drugs 3) The patient's current medications and supplements 4) Functional ability including ADL's, fall risk, home safety risk, hearing and visual impairment 5) Diet and physical activities 6) Evidence for depression or mood disorder 7) The patient's height, weight, and BMI have been recorded in the chart  I have made referrals, and provided counseling and education based on review of the above      Hyperlipidemia LDL goal <70    Continue statin given known CAD s/p Dylan Haynes. Fasting lipids needed       Lipoma of right shoulder    Surgery was postponed last year due to use of anti platelet agent.  Referral now made to Dylan Haynes        Other Visit Diagnoses    Prostate cancer screening    -  Primary   Relevant  Orders   PSA (Completed)   Lipoma of back       Relevant Orders   Ambulatory referral to General Surgery   Colon cancer screening       Relevant Orders   Ambulatory referral to General Surgery      I am having Dylan B. Haymer maintain his atorvastatin, carvedilol, CVS Aspirin Adult Low Dose, and losartan-hydrochlorothiazide.  No orders of the defined types were placed in this encounter.   There are no discontinued medications.  Follow-up: Return in about 6 months (around 10/13/2020).   Crecencio Mc, MD

## 2020-04-12 NOTE — Patient Instructions (Signed)
The new goals for optimal blood pressure management are 120/70  T0 130/80 .  Please continue check your blood pressure once a week  at home and send me the readings periodically so I can determine if you need a change in medication   Referral to Dr Job Founds for 1) lipoma removal 2) colonoscopy   Health Maintenance, Male Adopting a healthy lifestyle and getting preventive care are important in promoting health and wellness. Ask your health care provider about:  The right schedule for you to have regular tests and exams.  Things you can do on your own to prevent diseases and keep yourself healthy. What should I know about diet, weight, and exercise? Eat a healthy diet   Eat a diet that includes plenty of vegetables, fruits, low-fat dairy products, and lean protein.  Do not eat a lot of foods that are high in solid fats, added sugars, or sodium. Maintain a healthy weight Body mass index (BMI) is a measurement that can be used to identify possible weight problems. It estimates body fat based on height and weight. Your health care provider can help determine your BMI and help you achieve or maintain a healthy weight. Get regular exercise Get regular exercise. This is one of the most important things you can do for your health. Most adults should:  Exercise for at least 150 minutes each week. The exercise should increase your heart rate and make you sweat (moderate-intensity exercise).  Do strengthening exercises at least twice a week. This is in addition to the moderate-intensity exercise.  Spend less time sitting. Even light physical activity can be beneficial. Watch cholesterol and blood lipids Have your blood tested for lipids and cholesterol at 51 years of age, then have this test every 5 years. You may need to have your cholesterol levels checked more often if:  Your lipid or cholesterol levels are high.  You are older than 51 years of age.  You are at high risk for heart  disease. What should I know about cancer screening? Many types of cancers can be detected early and may often be prevented. Depending on your health history and family history, you may need to have cancer screening at various ages. This may include screening for:  Colorectal cancer.  Prostate cancer.  Skin cancer.  Lung cancer. What should I know about heart disease, diabetes, and high blood pressure? Blood pressure and heart disease  High blood pressure causes heart disease and increases the risk of stroke. This is more likely to develop in people who have high blood pressure readings, are of African descent, or are overweight.  Talk with your health care provider about your target blood pressure readings.  Have your blood pressure checked: ? Every 3-5 years if you are 39-47 years of age. ? Every year if you are 21 years old or older.  If you are between the ages of 101 and 72 and are a current or former smoker, ask your health care provider if you should have a one-time screening for abdominal aortic aneurysm (AAA). Diabetes Have regular diabetes screenings. This checks your fasting blood sugar level. Have the screening done:  Once every three years after age 75 if you are at a normal weight and have a low risk for diabetes.  More often and at a younger age if you are overweight or have a high risk for diabetes. What should I know about preventing infection? Hepatitis B If you have a higher risk for hepatitis B,  you should be screened for this virus. Talk with your health care provider to find out if you are at risk for hepatitis B infection. Hepatitis C Blood testing is recommended for:  Everyone born from [birth year] through 1967.  Anyone with known risk factors for hepatitis C. Sexually transmitted infections (STIs)  You should be screened each year for STIs, including gonorrhea and chlamydia, if: ? You are sexually active and are younger than 51 years of age. ? You are older  than 51 years of age and your health care provider tells you that you are at risk for this type of infection. ? Your sexual activity has changed since you were last screened, and you are at increased risk for chlamydia or gonorrhea. Ask your health care provider if you are at risk.  Ask your health care provider about whether you are at high risk for HIV. Your health care provider may recommend a prescription medicine to help prevent HIV infection. If you choose to take medicine to prevent HIV, you should first get tested for HIV. You should then be tested every 3 months for as long as you are taking the medicine. Follow these instructions at home: Lifestyle  Do not use any products that contain nicotine or tobacco, such as cigarettes, e-cigarettes, and chewing tobacco. If you need help quitting, ask your health care provider.  Do not use street drugs.  Do not share needles.  Ask your health care provider for help if you need support or information about quitting drugs. Alcohol use  Do not drink alcohol if your health care provider tells you not to drink.  If you drink alcohol: ? Limit how much you have to 0-2 drinks a day. ? Be aware of how much alcohol is in your drink. In the [place] one drink equals one 12 oz bottle of beer (355 mL), one 5 oz glass of wine (148 mL), or one 1 oz glass of hard liquor (44 mL). General instructions  Schedule regular health, dental, and eye exams.  Stay current with your vaccines.  Tell your health care provider if: ? You often feel depressed. ? You have ever been abused or do not feel safe at home. Summary  Adopting a healthy lifestyle and getting preventive care are important in promoting health and wellness.  Follow your health care provider's instructions about healthy diet, exercising, and getting tested or screened for diseases.  Follow your health care provider's instructions on monitoring your cholesterol and blood pressure. This  information is not intended to replace advice given to you by your health care provider. Make sure you discuss any questions you have with your health care provider. Document Revised: 09/08/2018 Document Reviewed: 09/08/2018 Elsevier Patient Education  2020 [employer].

## 2020-05-17 ENCOUNTER — Other Ambulatory Visit: Payer: Self-pay | Admitting: General Surgery

## 2020-05-17 LAB — HM COLONOSCOPY

## 2020-05-21 LAB — SURGICAL PATHOLOGY

## 2020-06-19 ENCOUNTER — Other Ambulatory Visit: Payer: Self-pay | Admitting: Internal Medicine

## 2020-08-03 ENCOUNTER — Other Ambulatory Visit: Payer: Self-pay | Admitting: Internal Medicine

## 2020-09-06 ENCOUNTER — Other Ambulatory Visit: Payer: Self-pay

## 2020-09-06 DIAGNOSIS — I10 Essential (primary) hypertension: Secondary | ICD-10-CM

## 2020-09-06 MED ORDER — LOSARTAN POTASSIUM-HCTZ 50-12.5 MG PO TABS: 0.5000 | ORAL_TABLET | Freq: Every day | ORAL | 0 refills | Status: DC

## 2020-09-12 ENCOUNTER — Other Ambulatory Visit: Payer: Self-pay

## 2020-09-12 DIAGNOSIS — I10 Essential (primary) hypertension: Secondary | ICD-10-CM

## 2020-09-12 MED ORDER — LOSARTAN POTASSIUM-HCTZ 50-12.5 MG PO TABS: 0.5000 | ORAL_TABLET | Freq: Every day | ORAL | 0 refills | Status: DC

## 2020-09-20 IMAGING — CT CT ANGIOGRAPHY CHEST
1 of 6 series · 3 of 16 positions shown · IV contrast (iopamidol)
Comparison: None.

CLINICAL DATA: 47-year-old male with concern for pulmonary
embolism.

EXAM:
CT ANGIOGRAPHY CHEST WITH CONTRAST
TECHNIQUE: Multidetector CT imaging of the chest was performed using the
standard protocol during bolus administration of intravenous
contrast. Multiplanar CT image reconstructions and MIPs were
obtained to evaluate the vascular anatomy.
CONTRAST:  100mL 31YE26-4LO IOPAMIDOL (31YE26-4LO) INJECTION 76%

[Series 9: pe thins · axial · 0.90mm/px · z∈[+93,+232]mm · 3 of 398 slices shown]
[im 100/398  lung]
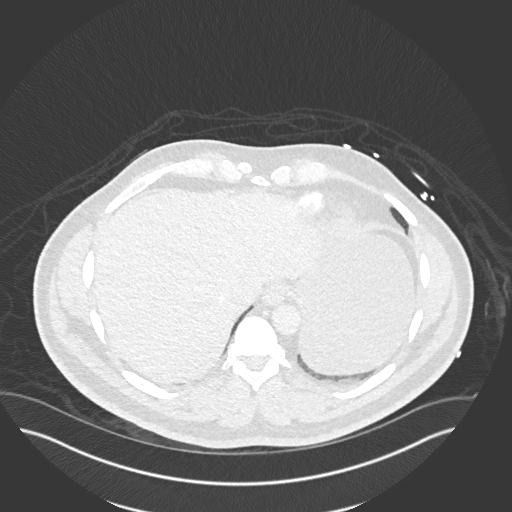
[im 199/398  soft-tissue]
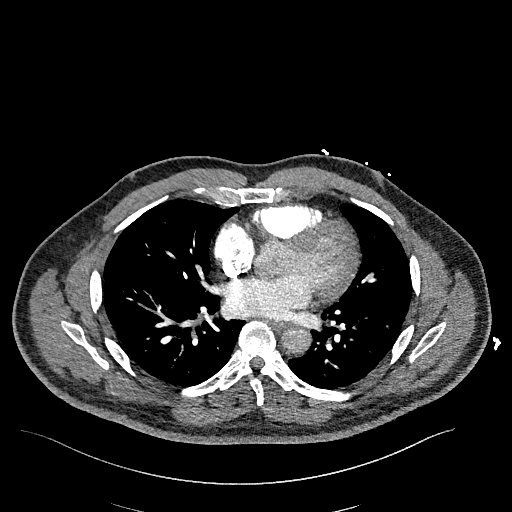
[im 298/398  lung]
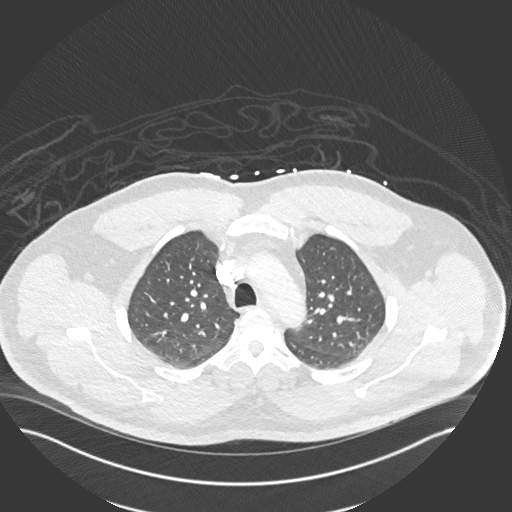

[3 of 16 positions shown; findings below may reference images not displayed]

FINDINGS: Cardiovascular: There is no cardiomegaly or pericardial effusion.
The thoracic aorta is unremarkable for the degree of opacification.
There is no CT evidence of pulmonary embolism.

Mediastinum/Nodes: No hilar or mediastinal adenopathy. The esophagus
is grossly unremarkable. No mediastinal fluid collection.

Lungs/Pleura: The lungs are clear. There is no pleural effusion or
pneumothorax. The central airways are patent.

Upper Abdomen: No acute abnormality.

Musculoskeletal: No chest wall abnormality. No acute or significant
osseous findings.

Review of the MIP images confirms the above findings.
IMPRESSION: No acute intrathoracic pathology. No CT evidence of pulmonary
embolism.

## 2020-10-15 ENCOUNTER — Ambulatory Visit: Payer: 59 | Admitting: Internal Medicine

## 2020-10-17 ENCOUNTER — Other Ambulatory Visit: Payer: Self-pay

## 2020-10-17 ENCOUNTER — Encounter: Payer: Self-pay | Admitting: Internal Medicine

## 2020-10-17 ENCOUNTER — Ambulatory Visit: Payer: 59 | Admitting: Internal Medicine

## 2020-10-17 DIAGNOSIS — D1721 Benign lipomatous neoplasm of skin and subcutaneous tissue of right arm: Secondary | ICD-10-CM

## 2020-10-17 DIAGNOSIS — Z3009 Encounter for other general counseling and advice on contraception: Secondary | ICD-10-CM | POA: Insufficient documentation

## 2020-10-17 DIAGNOSIS — I10 Essential (primary) hypertension: Secondary | ICD-10-CM | POA: Diagnosis not present

## 2020-10-17 DIAGNOSIS — R7989 Other specified abnormal findings of blood chemistry: Secondary | ICD-10-CM | POA: Insufficient documentation

## 2020-10-17 DIAGNOSIS — E663 Overweight: Secondary | ICD-10-CM | POA: Diagnosis not present

## 2020-10-17 DIAGNOSIS — E785 Hyperlipidemia, unspecified: Secondary | ICD-10-CM

## 2020-10-17 LAB — COMPREHENSIVE METABOLIC PANEL
ALT: 72 U/L — ABNORMAL HIGH (ref 0–53)
AST: 41 U/L — ABNORMAL HIGH (ref 0–37)
Albumin: 4.6 g/dL (ref 3.5–5.2)
Alkaline Phosphatase: 80 U/L (ref 39–117)
BUN: 10 mg/dL (ref 6–23)
CO2: 31 mEq/L (ref 19–32)
Calcium: 9.4 mg/dL (ref 8.4–10.5)
Chloride: 104 mEq/L (ref 96–112)
Creatinine, Ser: 1.26 mg/dL (ref 0.40–1.50)
GFR: 66.16 mL/min (ref >60.00)
Glucose, Bld: 86 mg/dL (ref 70–99)
Potassium: 4.4 mEq/L (ref 3.5–5.1)
Sodium: 140 mEq/L (ref 135–145)
Total Bilirubin: 1.4 mg/dL — ABNORMAL HIGH (ref 0.2–1.2)
Total Protein: 7.1 g/dL (ref 6.0–8.3)

## 2020-10-17 LAB — LIPID PANEL
Cholesterol: 125 mg/dL (ref 0–200)
HDL: 39.7 mg/dL (ref >39.00)
LDL Cholesterol: 74 mg/dL (ref 0–99)
NonHDL: 85.3
Total CHOL/HDL Ratio: 3
Triglycerides: 59 mg/dL (ref 0.0–149.0)
VLDL: 11.8 mg/dL (ref 0.0–40.0)

## 2020-10-17 MED ORDER — LOSARTAN POTASSIUM-HCTZ 50-12.5 MG PO TABS: 1.0000 | ORAL_TABLET | Freq: Every day | ORAL | 0 refills | Status: DC

## 2020-10-17 NOTE — Assessment & Plan Note (Signed)
S/p excision Sept 2021

## 2020-10-17 NOTE — Assessment & Plan Note (Signed)
He is not overweight based on exam , only by BMI. However regular exercise encouraged.  

## 2020-10-17 NOTE — Patient Instructions (Signed)
Happy New Year and congratulations to you and Mrs monette!   I have increased your losartan dose to a FULL TABLET.  Continue carvedilol as well  Goal BP is 120/70.  If Your home readings become too low,  ( < 110/60) let me know ASAP  Your annual prostate cancer screen was done in July and was NORMAL (SEE PSA)

## 2020-10-17 NOTE — Progress Notes (Signed)
Subjective:  Patient ID: Dylan Haynes, male    DOB: 1970/11/19  Age: 50 y.o. MRN: [id number]  CC: Diagnoses of Essential hypertension, Primary hypertension, Overweight (BMI 25.0-29.9), Hyperlipidemia LDL goal <70, Lipoma of right shoulder, Elevated LFTs, and General counselling and advice on contraception were pertinent to this visit.  HPI Dylan Haynes presents for follow up on hypertension   This visit occurred during the SARS-CoV-2 public health emergency.  Safety protocols were in place, including screening questions prior to the visit, additional usage of staff PPE, and extensive cleaning of exam room while observing appropriate contact time as indicated for disinfecting solutions.    Hypertension: patient has not been checking  blood pressure regularly despite having a home machine .  Last check was one week ago .  All Readings have been for the most part > 130/80 at rest . Patient is following a reduce salt diet most days and is taking medications as prescribed (coreg 6.25,  Losartan/hct 1/2 tablet daily)  Wife is 4 months pregnant. This will be his wife's 6 son/their 5th son    No covid infections in the family.  Fully vaccinated. Several employees at work infected with mild symptoms   colonoscopy done sept 2021  By Dr Bary Castilla .  No polyps per patient       Outpatient Medications Prior to Visit  Medication Sig Dispense Refill  . aspirin 81 MG chewable tablet CHEW 1 TABLET (81 MG TOTAL) BY MOUTH DAILY. 90 tablet 1  . atorvastatin (LIPITOR) 80 MG tablet TAKE 1 TABLET (80 MG TOTAL) BY MOUTH DAILY AT 6 PM. 90 tablet 3  . carvedilol (COREG) 6.25 MG tablet TAKE 1 TABLET (6.25 MG TOTAL) BY MOUTH 2 (TWO) TIMES DAILY WITH A MEAL. 180 tablet 3  . losartan-hydrochlorothiazide (HYZAAR) 50-12.5 MG tablet Take 0.5 tablets by mouth daily. In am. Please cut the pill for the patient 90 tablet 0   No facility-administered medications prior to visit.    Review of Systems;  Patient  denies headache, fevers, malaise, unintentional weight loss, skin rash, eye pain, sinus congestion and sinus pain, sore throat, dysphagia,  hemoptysis , cough, dyspnea, wheezing, chest pain, palpitations, orthopnea, edema, abdominal pain, nausea, melena, diarrhea, constipation, flank pain, dysuria, hematuria, urinary  Frequency, nocturia, numbness, tingling, seizures,  Focal weakness, Loss of consciousness,  Tremor, insomnia, depression, anxiety, and suicidal ideation.      Objective:  BP (!) 144/100 (BP Location: Left Arm, Patient Position: Sitting, Cuff Size: Large)   Pulse 72   Temp 98.2 F (36.8 C) (Oral)   Resp 15   Ht 6\' 3"  (1.905 m)   Wt 254 lb 3.2 oz (115.3 kg)   SpO2 97%   BMI 31.77 kg/m   BP Readings from Last 3 Encounters:  10/17/20 (!) 144/100  04/12/20 118/88  03/27/20 (!) 150/106    Wt Readings from Last 3 Encounters:  10/17/20 254 lb 3.2 oz (115.3 kg)  04/12/20 244 lb (110.7 kg)  03/27/20 249 lb (112.9 kg)    General appearance: alert, cooperative and appears stated age Ears: normal TM's and external ear canals both ears Throat: lips, mucosa, and tongue normal; teeth and gums normal Neck: no adenopathy, no carotid bruit, supple, symmetrical, trachea midline and thyroid not enlarged, symmetric, no tenderness/mass/nodules Back: symmetric, no curvature. ROM normal. No CVA tenderness. Lungs: clear to auscultation bilaterally Heart: regular rate and rhythm, S1, S2 normal, no murmur, click, rub or gallop Abdomen: soft, non-tender; bowel sounds normal; no masses,  no organomegaly Pulses: 2+ and symmetric Skin: Skin color, texture, turgor normal. No rashes or lesions Lymph nodes: Cervical, supraclavicular, and axillary nodes normal.  Lab Results  Component Value Date   HGBA1C 5.4 12/29/2018   HGBA1C 5.6 12/15/2018    Lab Results  Component Value Date   CREATININE 1.26 10/17/2020   CREATININE 1.30 01/03/2019   CREATININE 1.09 12/30/2018    Lab Results   Component Value Date   WBC 7.9 01/03/2019   HGB 15.1 01/03/2019   HCT 43.0 01/03/2019   PLT 201.0 01/03/2019   GLUCOSE 86 10/17/2020   CHOL 125 10/17/2020   TRIG 59.0 10/17/2020   HDL 39.70 10/17/2020   LDLDIRECT 188.4 12/29/2012   LDLCALC 74 10/17/2020   ALT 72 (H) 10/17/2020   AST 41 (H) 10/17/2020   NA 140 10/17/2020   K 4.4 10/17/2020   CL 104 10/17/2020   CREATININE 1.26 10/17/2020   BUN 10 10/17/2020   CO2 31 10/17/2020   TSH 1.86 12/15/2018   PSA 0.93 04/12/2020   HGBA1C 5.4 12/29/2018   MICROALBUR 1.2 04/12/2020     Assessment & Plan:   Problem List Items Addressed This Visit      Unprioritized   Elevated LFTs    Occurring in the setting of 10 lb weight gain.  He will  Return for repeat labs and if remains elevated will need to rule out autoimmune causes and fatty liver.       General counselling and advice on contraception    He is anticipating the birth of his 70th son and his wife's 6th son . ( She was hoping for a girl)   He is considering a vasectomy      Hyperlipidemia LDL goal <70    Continue atorvastatin 80 mg daily for known CAD s/p NSTEMI. Fasting lipids done; LDL is at goal   Lab Results  Component Value Date   CHOL 125 10/17/2020   HDL 39.70 10/17/2020   LDLCALC 74 10/17/2020   LDLDIRECT 188.4 12/29/2012   TRIG 59.0 10/17/2020   CHOLHDL 3 10/17/2020         Relevant Medications   losartan-hydrochlorothiazide (HYZAAR) 50-12.5 MG tablet   Hypertension    No at goal. Advised to continue carvedilol 6.26 mg bid and increase losartan to 50/12.5 mg daily . Lytes and Cr normal   Lab Results  Component Value Date   CREATININE 1.26 10/17/2020   Lab Results  Component Value Date   NA 140 10/17/2020   K 4.4 10/17/2020   CL 104 10/17/2020   CO2 31 10/17/2020         Relevant Medications   losartan-hydrochlorothiazide (HYZAAR) 50-12.5 MG tablet   Lipoma of right shoulder    S/p excision Sept 2021       Overweight (BMI 25.0-29.9)     He is not overweight based on exam , only by BMI. However regular exercise encouraged.        Other Visit Diagnoses    Essential hypertension       Relevant Medications   losartan-hydrochlorothiazide (HYZAAR) 50-12.5 MG tablet   Other Relevant Orders   Lipid panel (Completed)   Comprehensive metabolic panel (Completed)      I have changed Dylan Haynes's losartan-hydrochlorothiazide. I am also having him maintain his carvedilol, atorvastatin, and aspirin.  Meds ordered this encounter  Medications  . losartan-hydrochlorothiazide (HYZAAR) 50-12.5 MG tablet    Sig: Take 1 tablet by mouth daily. Note dose increase .  KEEP  ON FILE FOR FUTURE REFILLS    Dispense:  90 tablet    Refill:  0    Medications Discontinued During This Encounter  Medication Reason  . losartan-hydrochlorothiazide (HYZAAR) 50-12.5 MG tablet     Follow-up: No follow-ups on file.   Crecencio Mc, MD

## 2020-10-17 NOTE — Assessment & Plan Note (Addendum)
Continue atorvastatin 80 mg daily for known CAD s/p NSTEMI. Fasting lipids done; LDL is at goal   Lab Results  Component Value Date   CHOL 125 10/17/2020   HDL 39.70 10/17/2020   LDLCALC 74 10/17/2020   LDLDIRECT 188.4 12/29/2012   TRIG 59.0 10/17/2020   CHOLHDL 3 10/17/2020

## 2020-10-17 NOTE — Assessment & Plan Note (Addendum)
No at goal. Advised to continue carvedilol 6.26 mg bid and increase losartan to 50/12.5 mg daily . Lytes and Cr normal   Lab Results  Component Value Date   CREATININE 1.26 10/17/2020   Lab Results  Component Value Date   NA 140 10/17/2020   K 4.4 10/17/2020   CL 104 10/17/2020   CO2 31 10/17/2020

## 2020-10-17 NOTE — Assessment & Plan Note (Signed)
He is anticipating the birth of his 5th son and his wife's 6th son . ( She was hoping for a girl)   He is considering a vasectomy

## 2020-10-17 NOTE — Assessment & Plan Note (Signed)
Occurring in the setting of 10 lb weight gain.  He will  Return for repeat labs and if remains elevated will need to rule out autoimmune causes and fatty liver.

## 2021-01-14 ENCOUNTER — Other Ambulatory Visit: Payer: Self-pay | Admitting: Internal Medicine

## 2021-01-14 DIAGNOSIS — I10 Essential (primary) hypertension: Secondary | ICD-10-CM

## 2021-01-28 ENCOUNTER — Other Ambulatory Visit: Payer: Self-pay | Admitting: Internal Medicine

## 2021-01-31 ENCOUNTER — Encounter: Payer: Self-pay | Admitting: Cardiovascular Disease

## 2021-01-31 ENCOUNTER — Other Ambulatory Visit: Payer: Self-pay

## 2021-01-31 ENCOUNTER — Ambulatory Visit: Payer: 59 | Admitting: Cardiovascular Disease

## 2021-01-31 VITALS — BP 136/78 | HR 76 | Ht 77.0 in | Wt 252.8 lb

## 2021-01-31 DIAGNOSIS — I10 Essential (primary) hypertension: Secondary | ICD-10-CM

## 2021-01-31 DIAGNOSIS — I2510 Atherosclerotic heart disease of native coronary artery without angina pectoris: Secondary | ICD-10-CM

## 2021-01-31 DIAGNOSIS — E785 Hyperlipidemia, unspecified: Secondary | ICD-10-CM | POA: Diagnosis not present

## 2021-01-31 NOTE — Patient Instructions (Signed)
Medication Instructions:  Your physician recommends that you continue on your current medications as directed. Please refer to the Current Medication list given to you today.  *If you need a refill on your cardiac medications before your next appointment, please call your pharmacy*   Lab Work: None ordered  Testing/Procedures: None ordered   Follow-Up: At [facility], you and your health needs are our priority.  As part of our continuing mission to provide you with exceptional heart care, we have created designated Provider Care Teams.  These Care Teams include your primary Cardiologist (physician) and Advanced Practice Providers (APPs -  Physician Assistants and Nurse Practitioners) who all work together to provide you with the care you need, when you need it.  We recommend signing up for the patient portal called "MyChart".  Sign up information is provided on this After Visit Summary.  MyChart is used to connect with patients for Virtual Visits (Telemedicine).  Patients are able to view lab/test results, encounter notes, upcoming appointments, etc.  Non-urgent messages can be sent to your provider as well.   To learn more about what you can do with MyChart, go to [email or web address].    Your next appointment:   12 month(s)  The format for your next appointment:   In Person  Provider:   Sanda Klein, MD
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
## 2021-04-18 ENCOUNTER — Other Ambulatory Visit: Payer: Self-pay

## 2021-04-18 ENCOUNTER — Encounter: Payer: Self-pay | Admitting: Internal Medicine

## 2021-04-18 ENCOUNTER — Ambulatory Visit (INDEPENDENT_AMBULATORY_CARE_PROVIDER_SITE_OTHER): Payer: Self-pay | Admitting: Internal Medicine

## 2021-04-18 VITALS — BP 152/96 | HR 74 | Temp 95.7°F | Resp 15 | Ht 77.0 in | Wt 250.0 lb

## 2021-04-18 DIAGNOSIS — Z0000 Encounter for general adult medical examination without abnormal findings: Secondary | ICD-10-CM

## 2021-04-18 DIAGNOSIS — E785 Hyperlipidemia, unspecified: Secondary | ICD-10-CM

## 2021-04-18 DIAGNOSIS — E663 Overweight: Secondary | ICD-10-CM

## 2021-04-18 DIAGNOSIS — I10 Essential (primary) hypertension: Secondary | ICD-10-CM

## 2021-04-18 DIAGNOSIS — Z125 Encounter for screening for malignant neoplasm of prostate: Secondary | ICD-10-CM

## 2021-04-18 DIAGNOSIS — R7301 Impaired fasting glucose: Secondary | ICD-10-CM

## 2021-04-18 LAB — PSA: PSA: 1.1 ng/mL (ref 0.10–4.00)

## 2021-04-18 LAB — LIPID PANEL
Cholesterol: 130 mg/dL (ref 0–200)
HDL: 41 mg/dL (ref >39.00)
LDL Cholesterol: 76 mg/dL (ref 0–99)
NonHDL: 89.41
Total CHOL/HDL Ratio: 3
Triglycerides: 68 mg/dL (ref 0.0–149.0)
VLDL: 13.6 mg/dL (ref 0.0–40.0)

## 2021-04-18 LAB — COMPREHENSIVE METABOLIC PANEL
ALT: 50 U/L (ref 0–53)
AST: 29 U/L (ref 0–37)
Albumin: 4.4 g/dL (ref 3.5–5.2)
Alkaline Phosphatase: 69 U/L (ref 39–117)
BUN: 13 mg/dL (ref 6–23)
CO2: 27 mEq/L (ref 19–32)
Calcium: 9.3 mg/dL (ref 8.4–10.5)
Chloride: 102 mEq/L (ref 96–112)
Creatinine, Ser: 1.23 mg/dL (ref 0.40–1.50)
GFR: 67.86 mL/min (ref >60.00)
Glucose, Bld: 81 mg/dL (ref 70–99)
Potassium: 3.7 mEq/L (ref 3.5–5.1)
Sodium: 138 mEq/L (ref 135–145)
Total Bilirubin: 1.6 mg/dL — ABNORMAL HIGH (ref 0.2–1.2)
Total Protein: 7 g/dL (ref 6.0–8.3)

## 2021-04-18 LAB — HEMOGLOBIN A1C: Hgb A1c MFr Bld: 5.7 % (ref 4.6–6.5)

## 2021-04-18 NOTE — Progress Notes (Signed)
Patient ID: Dylan Haynes, male    DOB: Aug 17, 1971  Age: 50 y.o. MRN: [id number]  The patient is here for annual preventive  examination and management of other chronic and acute problems.   The risk factors are reflected in the social history.  The roster of all physicians providing medical care to patient - is listed in the Snapshot section of the chart.  Activities of daily living:  The patient is 100% independent in all ADLs: dressing, toileting, feeding as well as independent mobility  Home safety : The patient has smoke detectors in the home. They wear seatbelts.  There are no firearms at home. There is no violence in the home.   There is no risks for hepatitis, STDs or HIV. There is no   history of blood transfusion. They have no travel history to infectious disease endemic areas of the world.  The patient has seen their dentist in the last six month. They have seen their eye doctor in the last year. They deny hearing difficulty with regard to whispered voices and some television programs.  They have deferred audiologic testing in the last year.  They do not  have excessive sun exposure. Discussed the need for sun protection: hats, long sleeves and use of sunscreen if there is significant sun exposure.   Diet: the importance of a healthy diet is discussed. They do have a healthy diet.  The benefits of regular aerobic exercise were discussed. She walks 4 times per week ,  20 minutes.   Depression screen: there are no signs or vegative symptoms of depression- irritability, change in appetite, anhedonia, sadness/tearfullness.  The following portions of the patient's history were reviewed and updated as appropriate: allergies, current medications, past family history, past medical history,  past surgical history, past social history  and problem list.  Visual acuity was not assessed per patient preference since she has regular follow up with her ophthalmologist. Hearing and body mass  index were assessed and reviewed.   During the course of the visit the patient was educated and counseled about appropriate screening and preventive services including : fall prevention , diabetes screening, nutrition counseling, colorectal cancer screening, and recommended immunizations.    CC: The primary encounter diagnosis was Primary hypertension. Diagnoses of Hyperlipidemia LDL goal <70, Prostate cancer screening, Impaired fasting glucose, Encounter for preventive health examination, and Overweight (BMI 25.0-29.9) were also pertinent to this visit.  Dylan Haynes is a 49 yr old male with a history of CAD and hypertension who presents for annual exam.  Since his last visit he and his wife have had another child.  6 kids:  age 73 months to 42 yrs.  Son returning to [employer].  Some anxiety about him living off campus. 2) HTN:  bp at home 120 to 130 at home on the 2nd read (first one 140's) .  3) Weight gain:  not exercising    History Dylan Haynes has a past medical history of Hypertension.   He has a past surgical history that includes cyst removal in back; LEFT HEART CATH AND CORONARY ANGIOGRAPHY (N/A, 12/29/2018); and CORONARY STENT INTERVENTION (N/A, 12/29/2018).   His family history includes Cancer (age of onset: 30) in his maternal grandmother; Heart disease in his paternal uncle; Hypertension in his mother; Stroke in his mother.He reports that he has quit smoking. His smoking use included cigars. He has never used smokeless tobacco. He reports that he does not drink alcohol and does not use drugs.  Outpatient Medications Prior  to Visit  Medication Sig Dispense Refill   aspirin 81 MG chewable tablet CHEW 1 TABLET (81 MG TOTAL) BY MOUTH DAILY. 90 tablet 1   atorvastatin (LIPITOR) 80 MG tablet TAKE 1 TABLET (80 MG TOTAL) BY MOUTH DAILY AT 6 PM. 90 tablet 3   carvedilol (COREG) 6.25 MG tablet TAKE 1 TABLET (6.25 MG TOTAL) BY MOUTH 2 (TWO) TIMES DAILY WITH A MEAL. 180 tablet 3   losartan-hydrochlorothiazide  (HYZAAR) 50-12.5 MG tablet TAKE 1 TABLET BY MOUTH DAILY. NOTE DOSE INCREASE . KEEP ON FILE FOR FUTURE REFILLS 90 tablet 0   No facility-administered medications prior to visit.    Review of Systems  Patient denies headache, fevers, malaise, unintentional weight loss, skin rash, eye pain, sinus congestion and sinus pain, sore throat, dysphagia,  hemoptysis , cough, dyspnea, wheezing, chest pain, palpitations, orthopnea, edema, abdominal pain, nausea, melena, diarrhea, constipation, flank pain, dysuria, hematuria, urinary  Frequency, nocturia, numbness, tingling, seizures,  Focal weakness, Loss of consciousness,  Tremor, insomnia, depression, anxiety, and suicidal ideation.     Objective:  BP (!) 152/96 (BP Location: Left Arm, Patient Position: Sitting, Cuff Size: Large)   Pulse 74   Temp (!) 95.7 F (35.4 C) (Temporal)   Resp 15   Ht 6\' 5"  (1.956 m)   Wt 250 lb (113.4 kg)   SpO2 97%   BMI 29.65 kg/m   Physical Exam  General appearance: alert, cooperative and appears stated age Ears: normal TM's and external ear canals both ears Throat: lips, mucosa, and tongue normal; teeth and gums normal Neck: no adenopathy, no carotid bruit, supple, symmetrical, trachea midline and thyroid not enlarged, symmetric, no tenderness/mass/nodules Back: symmetric, no curvature. ROM normal. No CVA tenderness. Lungs: clear to auscultation bilaterally Heart: regular rate and rhythm, S1, S2 normal, no murmur, click, rub or gallop Abdomen: soft, non-tender; bowel sounds normal; no masses,  no organomegaly Pulses: 2+ and symmetric Skin: Skin color, texture, turgor normal. No rashes or lesions Lymph nodes: Cervical, supraclavicular, and axillary nodes normal.    Assessment & Plan:   Problem List Items Addressed This Visit       Unprioritized   Hypertension - Primary    Dylan Haynes reports compliance with medication regimen  but has an elevated reading today in office.  he is not using NSAIDs daily.  Discussed  goal of 120/70  (130/80 for patients over 70)  to preserve renal function.  he has been asked to check his  BP  at home and  submit readings for evaluation. Renal function, electrolytes and screen for proteinuria are all normal        Relevant Orders   Comprehensive metabolic panel (Completed)   Overweight (BMI 25.0-29.9)    Occurring in the setting of 10 lb weight gain.  He is at risk for and if remains elevated will need to rule out a  utoimmune causes and fatty liver. Lab Results  Component Value Date   HGBA1C 5.7 04/18/2021         Encounter for preventive health examination    age appropriate education and counseling updated, referrals for preventative services and immunizations addressed, dietary and smoking counseling addressed, most recent labs reviewed.  I have personally reviewed and have noted:   1) the patient's medical and social history 2) The pt's use of alcohol, tobacco, and illicit drugs 3) The patient's current medications and supplements 4) Functional ability including ADL's, fall risk, home safety risk, hearing and visual impairment 5) Diet and physical activities 6)  Evidence for depression or mood disorder 7) The patient's height, weight, and BMI have been recorded in the chart   I have made referrals, and provided counseling and education based on review of the above       Hyperlipidemia LDL goal <70   Relevant Orders   Lipid panel (Completed)   Other Visit Diagnoses     Prostate cancer screening       Relevant Orders   PSA (Completed)   Impaired fasting glucose       Relevant Orders   Hemoglobin A1c (Completed)       I am having Dylan Haynes maintain his carvedilol, atorvastatin, losartan-hydrochlorothiazide, and aspirin.  No orders of the defined types were placed in this encounter.   There are no discontinued medications.  Follow-up: Return in about 6 months (around 10/19/2021).   Crecencio Mc, MD

## 2021-04-18 NOTE — Patient Instructions (Addendum)
Congratulations on your new baby!  Prostate cancer screening is being today with  PSA   Please return next Tuesday at [facility] and with your home readings THE WEEK  AND for BP check  I want you to lose 10 lbs by your next visit (6 months)  through [place]

## 2021-04-21 NOTE — Assessment & Plan Note (Signed)
Occurring in the setting of 10 lb weight gain.  He is at risk for and if remains elevated will need to rule out a  utoimmune causes and fatty liver. Lab Results  Component Value Date   HGBA1C 5.7 04/18/2021

## 2021-04-21 NOTE — Assessment & Plan Note (Signed)
Dylan Haynes reports compliance with medication regimen  but has an elevated reading today in office.  he is not using NSAIDs daily.  Discussed goal of 120/70  (130/80 for patients over 70)  to preserve renal function.  he has been asked to check his  BP  at home and  submit readings for evaluation. Renal function, electrolytes and screen for proteinuria are all normal

## 2021-04-21 NOTE — Assessment & Plan Note (Signed)

## 2021-04-23 ENCOUNTER — Ambulatory Visit (INDEPENDENT_AMBULATORY_CARE_PROVIDER_SITE_OTHER): Payer: Self-pay

## 2021-04-23 ENCOUNTER — Other Ambulatory Visit: Payer: Self-pay

## 2021-04-23 VITALS — BP 136/81 | HR 66

## 2021-04-23 DIAGNOSIS — I10 Essential (primary) hypertension: Secondary | ICD-10-CM

## 2021-04-23 NOTE — Progress Notes (Signed)
Patient is here for a BP check due to bp being high at last visit, as per patient.  Currently patients BP is 136/81 and BPM is 66. Ten minutes prior BP was 145/93 and the BPM was 66 . Patient has no complaints of headaches, blurry vision, chest pain, arm pain, light headedness, dizziness, and nor jaw pain. Please see previous note for order.

## 2021-06-16 ENCOUNTER — Other Ambulatory Visit: Payer: Self-pay | Admitting: Internal Medicine

## 2021-08-09 ENCOUNTER — Other Ambulatory Visit: Payer: Self-pay | Admitting: Internal Medicine

## 2021-08-10 ENCOUNTER — Other Ambulatory Visit: Payer: Self-pay | Admitting: Internal Medicine

## 2021-08-10 DIAGNOSIS — I10 Essential (primary) hypertension: Secondary | ICD-10-CM

## 2021-10-21 ENCOUNTER — Encounter: Payer: Self-pay | Admitting: Internal Medicine

## 2021-10-21 ENCOUNTER — Other Ambulatory Visit: Payer: Self-pay

## 2021-10-21 ENCOUNTER — Ambulatory Visit: Payer: BC Managed Care – PPO | Admitting: Internal Medicine

## 2021-10-21 VITALS — BP 120/80 | HR 86 | Temp 98.5°F | Ht 77.0 in | Wt 244.8 lb

## 2021-10-21 DIAGNOSIS — D1721 Benign lipomatous neoplasm of skin and subcutaneous tissue of right arm: Secondary | ICD-10-CM

## 2021-10-21 DIAGNOSIS — E785 Hyperlipidemia, unspecified: Secondary | ICD-10-CM | POA: Diagnosis not present

## 2021-10-21 DIAGNOSIS — Z807 Family history of other malignant neoplasms of lymphoid, hematopoietic and related tissues: Secondary | ICD-10-CM

## 2021-10-21 DIAGNOSIS — I10 Essential (primary) hypertension: Secondary | ICD-10-CM

## 2021-10-21 DIAGNOSIS — I252 Old myocardial infarction: Secondary | ICD-10-CM | POA: Diagnosis not present

## 2021-10-21 DIAGNOSIS — R300 Dysuria: Secondary | ICD-10-CM | POA: Diagnosis not present

## 2021-10-21 LAB — PSA: PSA: 1.58 ng/mL (ref 0.10–4.00)

## 2021-10-21 LAB — COMPREHENSIVE METABOLIC PANEL
ALT: 51 U/L (ref 0–53)
AST: 32 U/L (ref 0–37)
Albumin: 4.5 g/dL (ref 3.5–5.2)
Alkaline Phosphatase: 73 U/L (ref 39–117)
BUN: 10 mg/dL (ref 6–23)
CO2: 32 mEq/L (ref 19–32)
Calcium: 9.8 mg/dL (ref 8.4–10.5)
Chloride: 103 mEq/L (ref 96–112)
Creatinine, Ser: 1.25 mg/dL (ref 0.40–1.50)
GFR: 66.32 mL/min (ref >60.00)
Glucose, Bld: 83 mg/dL (ref 70–99)
Potassium: 4.7 mEq/L (ref 3.5–5.1)
Sodium: 142 mEq/L (ref 135–145)
Total Bilirubin: 1.7 mg/dL — ABNORMAL HIGH (ref 0.2–1.2)
Total Protein: 7.3 g/dL (ref 6.0–8.3)

## 2021-10-21 LAB — URINALYSIS, ROUTINE W REFLEX MICROSCOPIC
Bilirubin Urine: NEGATIVE
Hgb urine dipstick: NEGATIVE
Ketones, ur: NEGATIVE
Leukocytes,Ua: NEGATIVE
Nitrite: NEGATIVE
RBC / HPF: NONE SEEN (ref 0–?)
Specific Gravity, Urine: 1.025 (ref 1.000–1.030)
Total Protein, Urine: NEGATIVE
Urine Glucose: NEGATIVE
Urobilinogen, UA: 1 (ref 0.0–1.0)
pH: 6 (ref 5.0–8.0)

## 2021-10-21 LAB — CBC WITH DIFFERENTIAL/PLATELET
Basophils Absolute: 0 10*3/uL (ref 0.0–0.1)
Basophils Relative: 0.3 % (ref 0.0–3.0)
Eosinophils Absolute: 0.1 10*3/uL (ref 0.0–0.7)
Eosinophils Relative: 1.3 % (ref 0.0–5.0)
HCT: 49.7 % (ref 39.0–52.0)
Hemoglobin: 16.7 g/dL (ref 13.0–17.0)
Lymphocytes Relative: 28.6 % (ref 12.0–46.0)
Lymphs Abs: 2.2 10*3/uL (ref 0.7–4.0)
MCHC: 33.5 g/dL (ref 30.0–36.0)
MCV: 90.7 fl (ref 78.0–100.0)
Monocytes Absolute: 0.7 10*3/uL (ref 0.1–1.0)
Monocytes Relative: 9.3 % (ref 3.0–12.0)
Neutro Abs: 4.7 10*3/uL (ref 1.4–7.7)
Neutrophils Relative %: 60.5 % (ref 43.0–77.0)
Platelets: 188 10*3/uL (ref 150.0–400.0)
RBC: 5.48 Mil/uL (ref 4.22–5.81)
RDW: 13.7 % (ref 11.5–15.5)
WBC: 7.8 10*3/uL (ref 4.0–10.5)

## 2021-10-21 LAB — LIPID PANEL
Cholesterol: 146 mg/dL (ref 0–200)
HDL: 44.1 mg/dL (ref >39.00)
LDL Cholesterol: 88 mg/dL (ref 0–99)
NonHDL: 101.62
Total CHOL/HDL Ratio: 3
Triglycerides: 68 mg/dL (ref 0.0–149.0)
VLDL: 13.6 mg/dL (ref 0.0–40.0)

## 2021-10-21 NOTE — Progress Notes (Addendum)
Subjective:  Patient ID: Dylan Haynes, male    DOB: 1971/07/02  Age: 51 y.o. MRN: [id number]  CC: The primary encounter diagnosis was Primary hypertension. Diagnoses of Dysuria, Hyperlipidemia LDL goal <70, History of non-ST elevation myocardial infarction (NSTEMI), Lipoma of right shoulder, and Family history of multiple myeloma were also pertinent to this visit.   This visit occurred during the SARS-CoV-2 public health emergency.  Safety protocols were in place, including screening questions prior to the visit, additional usage of staff PPE, and extensive cleaning of exam room while observing appropriate contact time as indicated for disinfecting solutions.    HPI Dylan Haynes presents for  Chief Complaint  Patient presents with   Follow-up    6 mo    1) urinary stream has been weak /slow  but intermittently so,  worried about prostate ca  and enlarged prostate.  Has peyronie's disease. Urology referral discussed again, but again deferred.  Reviewed last PSA July 2022 .  Reviewed otc meds incl use of first generation antihistamines in sleep aides.   2)  Hypertension: patient checks blood pressure twice weekly at home.  Readings have been for the most part > 140/80 at rest . Patient is following a reduced salt diet most days and is taking medications as prescribed .    3) commuting to [place] daily  for the past several months.  Commute is  1 hr 10 minutes  each way.  Has lost 6 lbs . Following a careful diet   4) CAD:  no history of recent chest pain.  Taking high potency statin . Beta blocker, asa and ARB  4) Happy marriage:  6 children,  all boys.    Outpatient Medications Prior to Visit  Medication Sig Dispense Refill   carvedilol (COREG) 6.25 MG tablet TAKE 1 TABLET (6.25 MG TOTAL) BY MOUTH 2 (TWO) TIMES DAILY WITH A MEAL. 180 tablet 3   [facility] ASPIRIN ADULT LOW DOSE 81 MG chewable tablet CHEW 1 TABLET BY MOUTH DAILY. 90 tablet 1   losartan-hydrochlorothiazide (HYZAAR)  50-12.5 MG tablet TAKE 1 TABLET BY MOUTH EVERY DAY 90 tablet 0   atorvastatin (LIPITOR) 80 MG tablet TAKE 1 TABLET (80 MG TOTAL) BY MOUTH DAILY AT 6 PM. 90 tablet 3   No facility-administered medications prior to visit.    Review of Systems;  Patient denies headache, fevers, malaise, unintentional weight loss, skin rash, eye pain, sinus congestion and sinus pain, sore throat, dysphagia,  hemoptysis , cough, dyspnea, wheezing, chest pain, palpitations, orthopnea, edema, abdominal pain, nausea, melena, diarrhea, constipation, flank pain, dysuria, hematuria, urinary  Frequency, nocturia, numbness, tingling, seizures,  Focal weakness, Loss of consciousness,  Tremor, insomnia, depression, anxiety, and suicidal ideation.      Objective:  BP 120/80 (BP Location: Left Arm, Patient Position: Sitting, Cuff Size: Large)    Pulse 86    Temp 98.5 F (36.9 C) (Oral)    Ht 6' 5"  (1.956 m)    Wt 244 lb 12.8 oz (111 kg)    SpO2 99%    BMI 29.03 kg/m   BP Readings from Last 3 Encounters:  10/21/21 120/80  04/23/21 136/81  04/18/21 (!) 152/96    Wt Readings from Last 3 Encounters:  10/21/21 244 lb 12.8 oz (111 kg)  04/18/21 250 lb (113.4 kg)  01/31/21 252 lb 12.8 oz (114.7 kg)    General appearance: alert, cooperative and appears stated age Ears: normal TM's and external ear canals both ears Throat: lips, mucosa,  and tongue normal; teeth and gums normal Neck: no adenopathy, no carotid bruit, supple, symmetrical, trachea midline and thyroid not enlarged, symmetric, no tenderness/mass/nodules Back: symmetric, no curvature. ROM normal. No CVA tenderness. Lungs: clear to auscultation bilaterally Heart: regular rate and rhythm, S1, S2 normal, no murmur, click, rub or gallop Abdomen: soft, non-tender; bowel sounds normal; no masses,  no organomegaly Pulses: 2+ and symmetric Skin: Skin color, texture, turgor normal. No rashes or lesions Lymph nodes: Cervical, supraclavicular, and axillary nodes  normal.  Lab Results  Component Value Date   HGBA1C 5.7 04/18/2021   HGBA1C 5.4 12/29/2018   HGBA1C 5.6 12/15/2018    Lab Results  Component Value Date   CREATININE 1.25 10/21/2021   CREATININE 1.23 04/18/2021   CREATININE 1.26 10/17/2020    Lab Results  Component Value Date   WBC 7.8 10/21/2021   HGB 16.7 10/21/2021   HCT 49.7 10/21/2021   PLT 188.0 10/21/2021   GLUCOSE 83 10/21/2021   CHOL 146 10/21/2021   TRIG 68.0 10/21/2021   HDL 44.10 10/21/2021   LDLDIRECT 188.4 12/29/2012   LDLCALC 88 10/21/2021   ALT 51 10/21/2021   AST 32 10/21/2021   NA 142 10/21/2021   K 4.7 10/21/2021   CL 103 10/21/2021   CREATININE 1.25 10/21/2021   BUN 10 10/21/2021   CO2 32 10/21/2021   TSH 1.86 12/15/2018   PSA 1.58 10/21/2021   HGBA1C 5.7 04/18/2021   MICROALBUR 1.2 04/12/2020      Assessment & Plan:   Problem List Items Addressed This Visit     Hypertension - Primary    Well controlled on current regimen. Renal function stable, no changes today.      Relevant Medications   rosuvastatin (CRESTOR) 40 MG tablet   Other Relevant Orders   Comprehensive metabolic panel (Completed)   Family history of multiple myeloma    Continue annual screening with CBC       Hyperlipidemia LDL goal <70    LDL not at goal.  Change to rosuvastatin 40 mg daily for  Goal LDL <  70  Lab Results  Component Value Date   CHOL 146 10/21/2021   HDL 44.10 10/21/2021   LDLCALC 88 10/21/2021   LDLDIRECT 188.4 12/29/2012   TRIG 68.0 10/21/2021   CHOLHDL 3 10/21/2021          Relevant Medications   rosuvastatin (CRESTOR) 40 MG tablet   Other Relevant Orders   Lipid panel (Completed)   History of non-ST elevation myocardial infarction (NSTEMI)    Secondary to 100% occlusion of RCA in 2020.  S/p stent with resolution to TIMI 0 .  Has fiished use of  Brilinta for one year,  continues to take ,  Statin and beta blocker .  EF normal,  No WMA,  And   LV hypertrophy with Grade I  diastolic  dysfunction noted on ECHO  Follow up with cardiology every 6 months      Lipoma of right shoulder    S/p resection in 2774 , healing complicated by keloid      Dysuria    With intermittent slowing of stream.  Checking PSA and urinalysis      Relevant Orders   PSA (Completed)   Urinalysis, Routine w reflex microscopic (Completed)   CBC with Differential/Platelet (Completed)    I have discontinued Alma B. Bacot's atorvastatin. I am also having him start on rosuvastatin. Additionally, I am having him maintain his carvedilol, [facility] Aspirin Adult Low Dose,  and losartan-hydrochlorothiazide.  Meds ordered this encounter  Medications   rosuvastatin (CRESTOR) 40 MG tablet    Sig: Take 1 tablet (40 mg total) by mouth daily.    Dispense:  90 tablet    Refill:  0     I provided  20 minutes of  face-to-face time during this encounter reviewing patient's current problems and past surgeries, labs and imaging studies, providing counseling on the above mentioned problems , and coordination  of care .   Follow-up: No follow-ups on file.   Crecencio Mc, MD

## 2021-10-21 NOTE — Assessment & Plan Note (Signed)
Continue annual screening with CBC

## 2021-10-21 NOTE — Assessment & Plan Note (Signed)
Well controlled on current regimen. Renal function stable, no changes today. 

## 2021-10-21 NOTE — Assessment & Plan Note (Signed)
S/p resection in 4497 , healing complicated by keloid

## 2021-10-21 NOTE — Assessment & Plan Note (Addendum)
LDL not at goal.  Change to rosuvastatin 40 mg daily for  Goal LDL <  70  Lab Results  Component Value Date   CHOL 146 10/21/2021   HDL 44.10 10/21/2021   LDLCALC 88 10/21/2021   LDLDIRECT 188.4 12/29/2012   TRIG 68.0 10/21/2021   CHOLHDL 3 10/21/2021

## 2021-10-21 NOTE — Assessment & Plan Note (Signed)
Secondary to 100% occlusion of RCA in 2020.  S/p stent with resolution to TIMI 0 .  Has fiished use of  Brilinta for one year,  continues to take ,  Statin and beta blocker .  EF normal,  No WMA,  And   LV hypertrophy with Grade I  diastolic dysfunction noted on ECHO  Follow up with cardiology every 6 months

## 2021-10-21 NOTE — Assessment & Plan Note (Signed)
With intermittent slowing of stream.  Checking PSA and urinalysis

## 2021-10-21 NOTE — Patient Instructions (Signed)
Your urinary symptoms may be due to medication side effects (antihistamines or sleep aids) or due to an enlarged prostate or due to your Peyronie's disease   PSA and Urinalysis today  Referral to Urology offered;  let me know if you want to proceed

## 2021-10-23 MED ORDER — ROSUVASTATIN CALCIUM 40 MG PO TABS: 40.0000 mg | ORAL_TABLET | Freq: Every day | ORAL | 0 refills | Status: DC

## 2021-10-23 NOTE — Addendum Note (Signed)
Addended by: Crecencio Mc on: 10/23/2021 01:01 PM   Modules accepted: Orders

## 2021-10-24 ENCOUNTER — Telehealth: Payer: Self-pay

## 2021-10-24 NOTE — Telephone Encounter (Signed)
Attempted to call pt in regards to lab results. Mail box full.

## 2021-11-05 NOTE — Telephone Encounter (Signed)
Patient returned office phone for lab results.

## 2021-11-05 NOTE — Telephone Encounter (Signed)
See result note.  

## 2021-11-19 ENCOUNTER — Other Ambulatory Visit: Payer: Self-pay | Admitting: Internal Medicine

## 2021-11-19 DIAGNOSIS — I10 Essential (primary) hypertension: Secondary | ICD-10-CM

## 2022-01-13 ENCOUNTER — Ambulatory Visit
Admission: EM | Admit: 2022-01-13 | Discharge: 2022-01-13 | Disposition: A | Discharge status: Home / Self Care | Payer: BC Managed Care – PPO | Attending: Emergency Medicine | Admitting: Emergency Medicine

## 2022-01-13 ENCOUNTER — Ambulatory Visit (INDEPENDENT_AMBULATORY_CARE_PROVIDER_SITE_OTHER): Payer: BC Managed Care – PPO

## 2022-01-13 DIAGNOSIS — S6991XA Unspecified injury of right wrist, hand and finger(s), initial encounter: Secondary | ICD-10-CM | POA: Diagnosis not present

## 2022-01-13 DIAGNOSIS — M25531 Pain in right wrist: Secondary | ICD-10-CM

## 2022-01-13 NOTE — Discharge Instructions (Addendum)
Your x-ray today did not show injury to the bone of your  wrist  . Your pain is most likely being caused by irritation to the soft tissues, this should improve as time progresses.  ? ?Take 600-800 three times a day for 5 day then as needed  ? ?You may apply heat or ice, whichever makes you feel better, to affected area in 15 minute intervals ? ?You may continue activity as tolerated, there is no injury therefore, it is important that you continue to move around so you do not loose strength to the area ? ?For the first 2-3 days you may wrap wrist with ace wrap for additional support while completing activities, once wrapped if you begin to experience numbness or tingling it is too tight, remove and redo, you should be able to easily fit one finger under wrap  ? ?If symptoms persist past 2 weeks, you may follow up at urgent care or with orthopedic specialist for evaluation, an orthopedic doctor specializes in the bone, they may provide  management such as but not limited to imaging, long term medications and physical therapy ? ?

## 2022-01-13 NOTE — ED Provider Notes (Signed)
?[place] ? ? ? ?CSN: [id number] ?Arrival date & time: 01/13/22  1937 ? ? ?  ? ?History   ?Chief Complaint ?Chief Complaint  ?Patient presents with  ? Wrist Pain  ?  Right  ? ? ?HPI ?Dylan Haynes is a 51 y.o. male.  ? ?Patient presents with right wrist pain and swelling for 1 day after hitting a punching bag while at work.  Swelling and pain has already begun to improve.  Range of motion is intact but does elicit pain.  Has attempted use of Tylenol which has been somewhat helpful.  Denies numbness, tingling, prior injury or trauma.  ? ?Past Medical History:  ?Diagnosis Date  ? Hypertension   ? ? ?Patient Active Problem List  ? Diagnosis Date Noted  ? Dysuria 10/21/2021  ? Elevated LFTs 10/17/2020  ? General counselling and advice on contraception 10/17/2020  ? Lipoma of right shoulder 04/12/2020  ? Hospital discharge follow-up 01/04/2019  ? History of non-ST elevation myocardial infarction (NSTEMI) 12/29/2018  ? Vitamin D deficiency 09/21/2016  ? Peyronie disease 01/22/2015  ? Encounter for preventive health examination 12/30/2012  ? Hyperlipidemia LDL goal <70 12/30/2012  ? Family history of multiple myeloma 10/14/2011  ? Overweight (BMI 25.0-29.9) 10/14/2011  ? Hypertension   ? ? ?Past Surgical History:  ?Procedure Laterality Date  ? CORONARY STENT INTERVENTION N/A 12/29/2018  ? Procedure: CORONARY STENT INTERVENTION;  Surgeon: Troy Sine, MD;  Location: [facility];  Service: Cardiovascular;  Laterality: N/A;  ? cyst removal in back    ? LEFT HEART CATH AND CORONARY ANGIOGRAPHY N/A 12/29/2018  ? Procedure: LEFT HEART CATH AND CORONARY ANGIOGRAPHY;  Surgeon: Troy Sine, MD;  Location: [facility];  Service: Cardiovascular;  Laterality: N/A;  ? ? ? ? ? ?Home Medications   ? ?Prior to Admission medications   ?Medication Sig Start Date End Date Taking? Authorizing Provider  ?carvedilol (COREG) 6.25 MG tablet TAKE 1 TABLET (6.25 MG TOTAL) BY MOUTH 2 (TWO) TIMES DAILY WITH A MEAL.  06/17/21  Yes Crecencio Mc, MD  ?[facility] ASPIRIN ADULT LOW DOSE 81 MG chewable tablet CHEW 1 TABLET BY MOUTH DAILY. 08/09/21  Yes Crecencio Mc, MD  ?losartan-hydrochlorothiazide (HYZAAR) 50-12.5 MG tablet TAKE 1 TABLET BY MOUTH EVERY DAY 11/19/21  Yes Crecencio Mc, MD  ?rosuvastatin (CRESTOR) 40 MG tablet Take 1 tablet (40 mg total) by mouth daily. 10/23/21  Yes Crecencio Mc, MD  ? ? ?Family History ?Family History  ?Problem Relation Age of Onset  ? Cancer Maternal Grandmother 59  ?     multiple myeloma  ? Heart disease Paternal Uncle   ?     53  ? Hypertension Mother   ? Stroke Mother   ? Drug abuse Neg Hx   ? ? ?Social History ?Social History  ? ?Tobacco Use  ? Smoking status: Former  ?  Types: Cigars  ? Smokeless tobacco: Never  ?Substance Use Topics  ? Alcohol use: No  ? Drug use: No  ? ? ? ?Allergies   ?Patient has no known allergies. ? ? ?Review of Systems ?Review of Systems ?Defer to HPI  ? ? ?Physical Exam ?Triage Vital Signs ?ED Triage Vitals  ?Enc Vitals Group  ?   BP 01/13/22 2011 (!) 148/97  ?   Pulse Rate 01/13/22 2011 63  ?   Resp --   ?   Temp 01/13/22 2011 97.6 ?F (36.4 ?C)  ?  Temp Source 01/13/22 2011 Oral  ?   SpO2 01/13/22 2011 98 %  ?   Weight 01/13/22 2010 245 lb (111.1 kg)  ?   Height 01/13/22 2010 6' 5"  (1.956 m)  ?   Head Circumference --   ?   Peak Flow --   ?   Pain Score 01/13/22 2009 5  ?   Pain Loc --   ?   Pain Edu? --   ?   Excl. in [place]? --   ? ?No data found. ? ?Updated Vital Signs ?BP (!) 148/97 (BP Location: Right Arm)   Pulse 63   Temp 97.6 ?F (36.4 ?C) (Oral)   Ht 6' 5"  (1.956 m)   Wt 245 lb (111.1 kg)   SpO2 98%   BMI 29.05 kg/m?  ? ?Visual Acuity ?Right Eye Distance:   ?Left Eye Distance:   ?Bilateral Distance:   ? ?Right Eye Near:   ?Left Eye Near:    ?Bilateral Near:    ? ?Physical Exam ?Constitutional:   ?   Appearance: Normal appearance.  ?HENT:  ?   Head: Normocephalic.  ?Eyes:  ?   Extraocular Movements: Extraocular movements intact.  ?Pulmonary:  ?   Effort:  Pulmonary effort is normal.  ?Musculoskeletal:  ?   Comments: Tenderness along the radial aspect of the right wrist without swelling, ecchymosis or deformity noted, able to complete range of motion, 2+ radial pulse, sensation intact, 5 out of 5 strength, negative Tinel and Phalen sign  ?Skin: ?   General: Skin is warm and dry.  ?Neurological:  ?   Mental Status: He is alert and oriented to person, place, and time. Mental status is at baseline.  ?Psychiatric:     ?   Mood and Affect: Mood normal.     ?   Behavior: Behavior normal.  ? ? ? ?UC Treatments / Results  ?Labs ?(all labs ordered are listed, but only abnormal results are displayed) ?Labs Reviewed - No data to display ? ?EKG ? ? ?Radiology ?No results found. ? ?Procedures ?Procedures (including critical care time) ? ?Medications Ordered in UC ?Medications - No data to display ? ?Initial Impression / Assessment and Plan / UC Course  ?I have reviewed the triage vital signs and the nursing notes. ? ?Pertinent labs & imaging results that were available during my care of the patient were reviewed by me and considered in my medical decision making (see chart for details). ? ?Acute pain of right wrist ? ?X-ray of right wrist negative, discussed findings with patient, etiology of symptoms is most likely irritation to the soft tissue recommended consistent use of ibuprofen for 5 days and RICE, heat, activity as tolerated, given walker referral to orthopedics if pain persist past 2 weeks ?Final Clinical Impressions(s) / UC Diagnoses  ? ?Final diagnoses:  ?None  ? ?Discharge Instructions   ?None ?  ? ?ED Prescriptions   ?None ?  ? ?PDMP not reviewed this encounter. ?  ?Hans Eden, NP ?01/15/22 2575 ? ?

## 2022-01-13 NOTE — ED Triage Notes (Signed)
Patient c.o having wrist pain. He had an event yesterday at his job and he hit a punching bag the wrong way. Yesterday he couldn't move it very well but he can today.  ?

## 2022-01-21 ENCOUNTER — Other Ambulatory Visit: Payer: Self-pay | Admitting: Internal Medicine

## 2022-02-18 ENCOUNTER — Other Ambulatory Visit: Payer: Self-pay | Admitting: Internal Medicine

## 2022-02-18 DIAGNOSIS — I10 Essential (primary) hypertension: Secondary | ICD-10-CM

## 2022-04-21 ENCOUNTER — Ambulatory Visit: Payer: BC Managed Care – PPO | Admitting: Internal Medicine

## 2022-04-21 ENCOUNTER — Encounter: Payer: Self-pay | Admitting: Internal Medicine

## 2022-04-21 VITALS — BP 146/92 | HR 67 | Temp 98.1°F | Ht 77.0 in | Wt 247.8 lb

## 2022-04-21 DIAGNOSIS — I10 Essential (primary) hypertension: Secondary | ICD-10-CM

## 2022-04-21 DIAGNOSIS — E785 Hyperlipidemia, unspecified: Secondary | ICD-10-CM

## 2022-04-21 DIAGNOSIS — Z0000 Encounter for general adult medical examination without abnormal findings: Secondary | ICD-10-CM

## 2022-04-21 DIAGNOSIS — E663 Overweight: Secondary | ICD-10-CM

## 2022-04-21 DIAGNOSIS — R7301 Impaired fasting glucose: Secondary | ICD-10-CM | POA: Diagnosis not present

## 2022-04-21 DIAGNOSIS — N486 Induration penis plastica: Secondary | ICD-10-CM

## 2022-04-21 LAB — COMPREHENSIVE METABOLIC PANEL
ALT: 38 U/L (ref 0–53)
AST: 25 U/L (ref 0–37)
Albumin: 4.6 g/dL (ref 3.5–5.2)
Alkaline Phosphatase: 55 U/L (ref 39–117)
BUN: 12 mg/dL (ref 6–23)
CO2: 31 mEq/L (ref 19–32)
Calcium: 9.5 mg/dL (ref 8.4–10.5)
Chloride: 102 mEq/L (ref 96–112)
Creatinine, Ser: 1.17 mg/dL (ref 0.40–1.50)
GFR: 71.55 mL/min (ref >60.00)
Glucose, Bld: 77 mg/dL (ref 70–99)
Potassium: 3.7 mEq/L (ref 3.5–5.1)
Sodium: 139 mEq/L (ref 135–145)
Total Bilirubin: 1.5 mg/dL — ABNORMAL HIGH (ref 0.2–1.2)
Total Protein: 7 g/dL (ref 6.0–8.3)

## 2022-04-21 LAB — HEMOGLOBIN A1C: Hgb A1c MFr Bld: 5.9 % (ref 4.6–6.5)

## 2022-04-21 LAB — MICROALBUMIN / CREATININE URINE RATIO
Creatinine,U: 248.3 mg/dL
Microalb Creat Ratio: 0.4 mg/g (ref 0.0–30.0)
Microalb, Ur: 1 mg/dL (ref 0.0–1.9)

## 2022-04-21 NOTE — Assessment & Plan Note (Signed)
Tolerating high potency statin  with crestor 40 mg daily   Lab Results  Component Value Date   CHOL 146 10/21/2021   HDL 44.10 10/21/2021   LDLCALC 88 10/21/2021   LDLDIRECT 188.4 12/29/2012   TRIG 68.0 10/21/2021   CHOLHDL 3 10/21/2021

## 2022-04-21 NOTE — Assessment & Plan Note (Signed)

## 2022-04-21 NOTE — Progress Notes (Unsigned)
The patient is here for annual preventive examination and management of other chronic and acute problems.   The risk factors are reflected in the social history.   The roster of all physicians providing medical care to patient - is listed in the Snapshot section of the chart.   Activities of daily living:  The patient is 100% independent in all ADLs: dressing, toileting, feeding as well as independent mobility   Home safety : The patient has smoke detectors in the home. They wear seatbelts.  There are no unsecured firearms at home. There is no violence in the home.    There is no risks for hepatitis, STDs or HIV. There is no   history of blood transfusion. They have no travel history to infectious disease endemic areas of the world.   The patient has seen their dentist in the last six month. They have seen their eye doctor in the last year. The patinet  denies slight hearing difficulty with regard to whispered voices and some television programs.  They have deferred audiologic testing in the last year.  They do not  have excessive sun exposure. Discussed the need for sun protection: hats, long sleeves and use of sunscreen if there is significant sun exposure.    Diet: the importance of a healthy diet is discussed. They do have a healthy diet.   The benefits of regular aerobic exercise were discussed. The patient  is not exercising regularly.    Depression screen: there are no signs or vegative symptoms of depression- irritability, change in appetite, anhedonia, sadness/tearfullness.   The following portions of the patient's history were reviewed and updated as appropriate: allergies, current medications, past family history, past medical history,  past surgical history, past social history  and problem list.   Visual acuity was not assessed per patient preference since the patient has regular follow up with an  ophthalmologist. Hearing and body mass index were assessed and reviewed.    During  the course of the visit the patient was educated and counseled about appropriate screening and preventive services including : fall prevention , diabetes screening, nutrition counseling, colorectal cancer screening, and recommended immunizations.    Chief Complaint   None   Review of Symptoms  Patient denies headache, fevers, malaise, unintentional weight loss, skin rash, eye pain, sinus congestion and sinus pain, sore throat, dysphagia,  hemoptysis , cough, dyspnea, wheezing, chest pain, palpitations, orthopnea, edema, abdominal pain, nausea, melena, diarrhea, constipation, flank pain, dysuria, hematuria, urinary  Frequency, nocturia, numbness, tingling, seizures,  Focal weakness, Loss of consciousness,  Tremor, insomnia, depression, anxiety, and suicidal ideation.    Physical Exam:  BP (!) 146/92 (BP Location: Left Arm, Patient Position: Sitting, Cuff Size: Large)   Pulse 67   Temp 98.1 F (36.7 C) (Oral)   Ht '6\' 5"'$  (1.956 m)   Wt 247 lb 12.8 oz (112.4 kg)   SpO2 99%   BMI 29.38 kg/m    General appearance: alert, cooperative and appears stated age Ears: normal TM's and external ear canals both ears Throat: lips, mucosa, and tongue normal; teeth and gums normal Neck: no adenopathy, no carotid bruit, supple, symmetrical, trachea midline and thyroid not enlarged, symmetric, no tenderness/mass/nodules Back: symmetric, no curvature. ROM normal. No CVA tenderness. Lungs: clear to auscultation bilaterally Heart: regular rate and rhythm, S1, S2 normal, no murmur, click, rub or gallop Abdomen: soft, non-tender; bowel sounds normal; no masses,  no organomegaly Pulses: 2+ and symmetric Skin: Skin color, texture, turgor normal. No  rashes or lesions Lymph nodes: Cervical, supraclavicular, and axillary nodes normal.    Assessment and Plan:  Encounter for preventive health examination age appropriate education and counseling updated, referrals for preventative services and immunizations  addressed, dietary and smoking counseling addressed, most recent labs reviewed.  I have personally reviewed and have noted:   1) the patient's medical and social history 2) The pt's use of alcohol, tobacco, and illicit drugs 3) The patient's current medications and supplements 4) Functional ability including ADL's, fall risk, home safety risk, hearing and visual impairment 5) Diet and physical activities 6) Evidence for depression or mood disorder 7) The patient's height, weight, and BMI have been recorded in the chart  I have made referrals, and provided counseling and education based on review of the above  Overweight (BMI 25.0-29.9) I have addressed  BMI and recommended wt loss of 10% of body weigh over the next 6 months using a low glycemic index diet and regular exercise a minimum of 5 days per week.    Peyronie disease He has declined urology referral   Hyperlipidemia LDL goal <70 Tolerating high potency statin  with crestor 40 mg daily   Lab Results  Component Value Date   CHOL 146 10/21/2021   HDL 44.10 10/21/2021   LDLCALC 88 10/21/2021   LDLDIRECT 188.4 12/29/2012   TRIG 68.0 10/21/2021   CHOLHDL 3 10/21/2021    White coat syndrome with hypertension Home readings often initially mildly elevated but normalize after 5 minutes of relaxing .  Reviewed proper way to check BP and current goal of 130/80 or less . Tolerating carvedilol and losartan/hct   Updated Medication List Outpatient Encounter Medications as of 04/21/2022  Medication Sig   carvedilol (COREG) 6.25 MG tablet TAKE 1 TABLET (6.25 MG TOTAL) BY MOUTH 2 (TWO) TIMES DAILY WITH A MEAL.   [facility] ASPIRIN ADULT LOW DOSE 81 MG chewable tablet CHEW 1 TABLET BY MOUTH EVERY DAY   losartan-hydrochlorothiazide (HYZAAR) 50-12.5 MG tablet TAKE 1 TABLET BY MOUTH EVERY DAY   rosuvastatin (CRESTOR) 40 MG tablet TAKE 1 TABLET BY MOUTH EVERY DAY   No facility-administered encounter medications on file as of 04/21/2022.

## 2022-04-21 NOTE — Assessment & Plan Note (Signed)
I have addressed  BMI and recommended wt loss of 10% of body weigh over the next 6 months using a low glycemic index diet and regular exercise a minimum of 5 days per week.   

## 2022-04-21 NOTE — Assessment & Plan Note (Signed)
He has declined urology referral

## 2022-04-21 NOTE — Assessment & Plan Note (Signed)
Home readings often initially mildly elevated but normalize after 5 minutes of relaxing .  Reviewed proper way to check BP and current goal of 130/80 or less . Tolerating carvedilol and losartan/hct

## 2022-04-22 LAB — LIPID PANEL W/REFLEX DIRECT LDL
Cholesterol: 122 mg/dL (ref <200)
HDL: 45 mg/dL (ref >=40)
LDL Cholesterol (Calc): 62 mg/dL (calc)
Non-HDL Cholesterol (Calc): 77 mg/dL (calc) (ref <130)
Total CHOL/HDL Ratio: 2.7 (calc) (ref <5.0)
Triglycerides: 71 mg/dL (ref <150)

## 2022-04-29 ENCOUNTER — Other Ambulatory Visit: Payer: Self-pay | Admitting: Internal Medicine

## 2022-05-20 ENCOUNTER — Other Ambulatory Visit: Payer: Self-pay | Admitting: Internal Medicine

## 2022-07-24 ENCOUNTER — Other Ambulatory Visit: Payer: Self-pay | Admitting: Internal Medicine

## 2022-08-19 ENCOUNTER — Other Ambulatory Visit: Payer: Self-pay | Admitting: Internal Medicine

## 2022-08-19 DIAGNOSIS — I10 Essential (primary) hypertension: Secondary | ICD-10-CM

## 2022-08-28 ENCOUNTER — Ambulatory Visit: Payer: BC Managed Care – PPO | Attending: Cardiovascular Disease | Admitting: Cardiovascular Disease

## 2022-08-28 ENCOUNTER — Encounter: Payer: Self-pay | Admitting: Cardiovascular Disease

## 2022-08-28 VITALS — BP 132/94 | HR 72 | Ht 77.0 in | Wt 250.0 lb

## 2022-08-28 DIAGNOSIS — I2510 Atherosclerotic heart disease of native coronary artery without angina pectoris: Secondary | ICD-10-CM | POA: Diagnosis not present

## 2022-08-28 DIAGNOSIS — I10 Essential (primary) hypertension: Secondary | ICD-10-CM | POA: Diagnosis not present

## 2022-08-28 DIAGNOSIS — E785 Hyperlipidemia, unspecified: Secondary | ICD-10-CM

## 2022-08-28 MED ORDER — CARVEDILOL 12.5 MG PO TABS: 12.5000 mg | ORAL_TABLET | Freq: Two times a day (BID) | ORAL | 3 refills | Status: DC

## 2022-08-28 NOTE — Patient Instructions (Addendum)
Medication Instructions:  INCREASE Carvedilol (Coreg) to 12.5 mg 2 times a day   *If you need a refill on your cardiac medications before your next appointment, please call your pharmacy*  Lab Work: NONE ordered at this time of appointment   If you have labs (blood work) drawn today and your tests are completely normal, you will receive your results only by: [place] (if you have MyChart) OR A paper copy in the mail If you have any lab test that is abnormal or we need to change your treatment, we will call you to review the results.  Testing/Procedures: NONE ordered at this time of appointment   Follow-Up: At [facility], you and your health needs are our priority.  As part of our continuing mission to provide you with exceptional heart care, we have created designated Provider Care Teams.  These Care Teams include your primary Cardiologist (physician) and Advanced Practice Providers (APPs -  Physician Assistants and Nurse Practitioners) who all work together to provide you with the care you need, when you need it.  Your next appointment:   1 year(s)  The format for your next appointment:   In Person  Provider:   Sanda Klein, MD     Other Instructions Continue to monitor blood pressures at home. Send readings via MyChart.   Important Information About Sugar

## 2022-08-28 NOTE — Progress Notes (Signed)
Cardiology office note    Date:  08/31/2022   ID:  Dylan Haynes, DOB 1971/01/14, MRN [id number]  PCP:  Crecencio Mc, MD  Cardiologist:  Sanda Klein, MD  Electrophysiologist:  None   Evaluation Performed:  Follow-Up Visit  Chief Complaint:  F/U CAD  History of Present Illness:    Dylan Haynes is a 51 y.o. male with hypercholesterolemia and essential hypertension who presented with non-STEMI due to occlusion of the distal right coronary artery in April 2020 treated with urgent PCI (2.5 x 12 mm resolute DES).  He also had evidence of mild stenoses in the mid RCA and right posterior lateral ventricular branch, but Haynes significant lesions in the left coronary system.  The echocardiogram performed the day following his infarction showed normal LVEF and wall motion with moderate LVH.  There was evidence of moderate diastolic dysfunction both by LVEDP measurement and echo parameters.  Continues to do well and remains asymptomatic from a cardiovascular point of view. The patient specifically denies any chest pain at rest exertion, dyspnea at rest or with exertion, orthopnea, paroxysmal nocturnal dyspnea, syncope, palpitations, focal neurological deficits, intermittent claudication, lower extremity edema, unexplained weight gain, cough, hemoptysis or wheezing.  Denies erectile dysfunction.  While he's been exercising less consistently, he continues to be able to do intense physical activity without any trouble.  He is overweight, borderline obese.  He lost 10 pounds following his myocardial infarction, but has gained that weight back.  His hemoglobin A1c is up to 5.9%, but his lipid profile looks favorable with a LDL cholesterol of 62.  Renal function is normal.  His blood pressure is a little higher.   Past Medical History:  Diagnosis Date   Hypertension    Past Surgical History:  Procedure Laterality Date   CORONARY STENT INTERVENTION N/A 12/29/2018   Procedure: CORONARY STENT  INTERVENTION;  Surgeon: Troy Sine, MD;  Location: [facility];  Service: Cardiovascular;  Laterality: N/A;   cyst removal in back     LEFT HEART CATH AND CORONARY ANGIOGRAPHY N/A 12/29/2018   Procedure: LEFT HEART CATH AND CORONARY ANGIOGRAPHY;  Surgeon: Troy Sine, MD;  Location: [facility];  Service: Cardiovascular;  Laterality: N/A;     Current Meds  Medication Sig   ASPIRIN LOW DOSE 81 MG chewable tablet CHEW 1 TABLET BY MOUTH EVERY DAY   losartan-hydrochlorothiazide (HYZAAR) 50-12.5 MG tablet TAKE 1 TABLET BY MOUTH EVERY DAY   rosuvastatin (CRESTOR) 40 MG tablet TAKE 1 TABLET BY MOUTH EVERY DAY   [DISCONTINUED] carvedilol (COREG) 6.25 MG tablet TAKE 1 TABLET BY MOUTH 2 TIMES DAILY WITH A MEAL.     Allergies:   Patient has Haynes known allergies.   Social History   Tobacco Use   Smoking status: Former    Types: Cigars   Smokeless tobacco: Never  Substance Use Topics   Alcohol use: Haynes   Drug use: Haynes     Family Hx: The patient's family history includes Cancer (age of onset: 26) in his maternal grandmother; Heart disease in his paternal uncle; Hypertension in his mother; Stroke in his mother. There is Haynes history of Drug abuse. He was adopted.  ROS:   Please see the history of present illness.    All other systems reviewed and are negative.   Prior CV studies:   The following studies were reviewed today:  Labs/Other Tests and Data Reviewed:    EKG: Ordered today, shows normal sinus rhythm  and is a completely normal tracing.  QTc 425 ms.   Cardiac catheterization on 12/29/2018  Mid RCA lesion is 20% stenosed. Post Atrio lesion is 100% stenosed. Post intervention, there is a 0% residual stenosis. 1st RPLB lesion is 30% stenosed. There is mild left ventricular systolic dysfunction. LV end diastolic pressure is mildly elevated. There is Haynes mitral valve regurgitation. A stent was successfully placed.   Acute coronary syndrome secondary to total  occlusion of the distal RCA immediately after a small PDA vessel with initial TIMI 0 flow.  There was  faint left to right collateralization dominantly from the LAD.   Normal left coronary circulation with a large LAD, and left circumflex coronary artery giving rise to a large bifurcating OM1 branch, a diminutive OM 2 vessel distal vessel supplying a portion of the posterior lateral wall.   Mild acute LV dysfunction with an EF of 50% with mild mid inferior hypocontractility.  LVEDP mildly increased at 20 mm.   Successful PCI to the totally occluded RCA with insertion of a 2.5 x 12 mm Resolute DES stent postdilated to 3.7 mm with 100% occlusion being reduced to 0%.  There was mild haziness at the ostium of the inferolateral branch suggesting mild thrombus and there was brisk TIMI-3 flow down the large PLA vessel.  Aggrastat bolus plus infusion was administered with plans to continue Aggrastat for 18 hours post PCI.   RECOMMENDATION: DAPT for minimum of 1 year.  Aggressive lipid-lowering therapy with consideration for PCSK9 inhibition in addition to high potency statin/ezetamide with target LDL less than 70.  Optimal blood pressure control with ideal blood pressure less than 120/80.  Diagnostic Dominance: Right  Intervention    Implants   Permanent Stent  Stent Resolute Onyx 2.5x12       ECHOCARDIOGRAM 12/30/2018   1. The left ventricle has normal systolic function with an ejection fraction of 60-65%. The cavity size was normal. There is moderately increased left ventricular wall thickness. Left ventricular diastolic Doppler parameters are consistent with impaired  relaxation. Indeterminate filling pressures The E/e' is 8-15. Haynes evidence of left ventricular regional wall motion abnormalities.  2. The right ventricle has normal systolic function. The cavity was normal. There is Haynes increase in right ventricular wall thickness.  3. The mitral valve is grossly normal.  4. The tricuspid  valve is grossly normal.  5. The aortic valve is tricuspid.  6. The aortic root and ascending aorta are normal in size and structure.    Recent Labs: 10/21/2021: Hemoglobin 16.7; Platelets 188.0 04/21/2022: ALT 38; BUN 12; Creatinine, Ser 1.17; Potassium 3.7; Sodium 139   Recent Lipid Panel Lab Results  Component Value Date/Time   CHOL 122 04/21/2022 10:27 AM   CHOL 103 02/04/2019 09:25 AM   TRIG 71 04/21/2022 10:27 AM   HDL 45 04/21/2022 10:27 AM   HDL 36 (L) 02/04/2019 09:25 AM   CHOLHDL 2.7 04/21/2022 10:27 AM   LDLCALC 62 04/21/2022 10:27 AM   LDLDIRECT 188.4 12/29/2012 11:58 AM    Wt Readings from Last 3 Encounters:  08/28/22 113.4 kg  04/21/22 112.4 kg  01/13/22 111.1 kg     Objective:    Vital Signs:  BP (!) 132/94 (BP Location: Left Arm, Patient Position: Sitting, Cuff Size: Large)   Pulse 72   Ht '6\' 5"'$  (1.956 m)   Wt 113.4 kg   SpO2 96%   BMI 29.65 kg/m    General: Alert, oriented x3, Haynes distress, overweight, but also appears very  muscular and fit Head: Haynes evidence of trauma, PERRL, EOMI, Haynes exophtalmos or lid lag, Haynes myxedema, Haynes xanthelasma; normal ears, nose and oropharynx Neck: normal jugular venous pulsations and Haynes hepatojugular reflux; brisk carotid pulses without delay and Haynes carotid bruits Chest: clear to auscultation, Haynes signs of consolidation by percussion or palpation, normal fremitus, symmetrical and full respiratory excursions Cardiovascular: normal position and quality of the apical impulse, regular rhythm, normal first and second heart sounds, Haynes murmurs, rubs or gallops Abdomen: Haynes tenderness or distention, Haynes masses by palpation, Haynes abnormal pulsatility or arterial bruits, normal bowel sounds, Haynes hepatosplenomegaly Extremities: Haynes clubbing, cyanosis or edema; 2+ radial, ulnar and brachial pulses bilaterally; 2+ right femoral, posterior tibial and dorsalis pedis pulses; 2+ left femoral, posterior tibial and dorsalis pedis pulses; Haynes subclavian or  femoral bruits Neurological: grossly nonfocal Psych: Normal mood and affect   ASSESSMENT & PLAN:    1. Coronary artery disease involving native coronary artery of native heart without angina pectoris   2. Essential hypertension   3. Hyperlipidemia LDL goal <70      CAD: Asymptomatic.  On aspirin, high-dose statin, beta-blocker. HTN: Diastolic blood pressure is high.  Increase carvedilol to 12.5 mg twice daily.  Continue losartan-HCTZ. HLP: LDL cholesterol is within target range less than 70 and his HDL has improved to 45.  Nevertheless he would still benefit from weight loss and more consistent physical exercise.  Discussed healthy diet and exercise habits as a lifelong commitment to prevent future vascular events.  Patient Instructions  Medication Instructions:  INCREASE Carvedilol (Coreg) to 12.5 mg 2 times a day   *If you need a refill on your cardiac medications before your next appointment, please call your pharmacy*  Lab Work: NONE ordered at this time of appointment   If you have labs (blood work) drawn today and your tests are completely normal, you will receive your results only by: [place] (if you have MyChart) OR A paper copy in the mail If you have any lab test that is abnormal or we need to change your treatment, we will call you to review the results.  Testing/Procedures: NONE ordered at this time of appointment   Follow-Up: At [facility], you and your health needs are our priority.  As part of our continuing mission to provide you with exceptional heart care, we have created designated Provider Care Teams.  These Care Teams include your primary Cardiologist (physician) and Advanced Practice Providers (APPs -  Physician Assistants and Nurse Practitioners) who all work together to provide you with the care you need, when you need it.  Your next appointment:   1 year(s)  The format for your next appointment:   In Person  Provider:   Sanda Klein, MD     Other Instructions Continue to monitor blood pressures at home. Send readings via MyChart.   Important Information About Sugar          Signed, Sanda Klein, MD  08/31/2022 2:25 PM    [place]

## 2022-10-21 ENCOUNTER — Other Ambulatory Visit: Payer: Self-pay | Admitting: Internal Medicine

## 2022-10-22 ENCOUNTER — Encounter: Payer: Self-pay | Admitting: Internal Medicine

## 2022-10-22 ENCOUNTER — Ambulatory Visit: Payer: BC Managed Care – PPO | Admitting: Internal Medicine

## 2022-10-22 VITALS — BP 128/82 | HR 75 | Temp 98.1°F | Ht 77.0 in | Wt 246.2 lb

## 2022-10-22 DIAGNOSIS — Z79899 Other long term (current) drug therapy: Secondary | ICD-10-CM

## 2022-10-22 DIAGNOSIS — E785 Hyperlipidemia, unspecified: Secondary | ICD-10-CM | POA: Diagnosis not present

## 2022-10-22 DIAGNOSIS — I252 Old myocardial infarction: Secondary | ICD-10-CM

## 2022-10-22 DIAGNOSIS — I10 Essential (primary) hypertension: Secondary | ICD-10-CM

## 2022-10-22 DIAGNOSIS — R7989 Other specified abnormal findings of blood chemistry: Secondary | ICD-10-CM

## 2022-10-22 DIAGNOSIS — Z125 Encounter for screening for malignant neoplasm of prostate: Secondary | ICD-10-CM | POA: Diagnosis not present

## 2022-10-22 DIAGNOSIS — Z807 Family history of other malignant neoplasms of lymphoid, hematopoietic and related tissues: Secondary | ICD-10-CM

## 2022-10-22 LAB — CBC WITH DIFFERENTIAL/PLATELET
Basophils Absolute: 0 10*3/uL (ref 0.0–0.1)
Basophils Relative: 0.3 % (ref 0.0–3.0)
Eosinophils Absolute: 0.1 10*3/uL (ref 0.0–0.7)
Eosinophils Relative: 1.4 % (ref 0.0–5.0)
HCT: 46.2 % (ref 39.0–52.0)
Hemoglobin: 15.5 g/dL (ref 13.0–17.0)
Lymphocytes Relative: 32.8 % (ref 12.0–46.0)
Lymphs Abs: 2 10*3/uL (ref 0.7–4.0)
MCHC: 33.6 g/dL (ref 30.0–36.0)
MCV: 90.4 fl (ref 78.0–100.0)
Monocytes Absolute: 0.6 10*3/uL (ref 0.1–1.0)
Monocytes Relative: 10.4 % (ref 3.0–12.0)
Neutro Abs: 3.3 10*3/uL (ref 1.4–7.7)
Neutrophils Relative %: 55.1 % (ref 43.0–77.0)
Platelets: 185 10*3/uL (ref 150.0–400.0)
RBC: 5.12 Mil/uL (ref 4.22–5.81)
RDW: 13.7 % (ref 11.5–15.5)
WBC: 6.1 10*3/uL (ref 4.0–10.5)

## 2022-10-22 LAB — COMPREHENSIVE METABOLIC PANEL
ALT: 56 U/L — ABNORMAL HIGH (ref 0–53)
AST: 26 U/L (ref 0–37)
Albumin: 4.5 g/dL (ref 3.5–5.2)
Alkaline Phosphatase: 60 U/L (ref 39–117)
BUN: 10 mg/dL (ref 6–23)
CO2: 31 mEq/L (ref 19–32)
Calcium: 9.5 mg/dL (ref 8.4–10.5)
Chloride: 102 mEq/L (ref 96–112)
Creatinine, Ser: 1.1 mg/dL (ref 0.40–1.50)
GFR: 76.77 mL/min (ref >60.00)
Glucose, Bld: 87 mg/dL (ref 70–99)
Potassium: 3.9 mEq/L (ref 3.5–5.1)
Sodium: 139 mEq/L (ref 135–145)
Total Bilirubin: 1.3 mg/dL — ABNORMAL HIGH (ref 0.2–1.2)
Total Protein: 7.1 g/dL (ref 6.0–8.3)

## 2022-10-22 LAB — LIPID PANEL
Cholesterol: 130 mg/dL (ref 0–200)
HDL: 42.4 mg/dL (ref >39.00)
LDL Cholesterol: 74 mg/dL (ref 0–99)
NonHDL: 87.68
Total CHOL/HDL Ratio: 3
Triglycerides: 70 mg/dL (ref 0.0–149.0)
VLDL: 14 mg/dL (ref 0.0–40.0)

## 2022-10-22 LAB — PSA: PSA: 1.59 ng/mL (ref 0.10–4.00)

## 2022-10-22 LAB — LDL CHOLESTEROL, DIRECT: Direct LDL: 73 mg/dL

## 2022-10-22 LAB — TSH: TSH: 1.5 u[IU]/mL (ref 0.35–5.50)

## 2022-10-22 NOTE — Assessment & Plan Note (Signed)
Currently taking carvedilol 12.5 mg bid and losarta 50/12.5  once daily.  he reports compliance with medication regimen  but has an elevated reading today in office.  He has been asked to check his BP at work and  submit readings for evaluation. Renal function will be checked today

## 2022-10-22 NOTE — Patient Instructions (Addendum)
The new goals for optimal blood pressure management are 120/80 or lower.  Continue your current doses of carvedilol and losartan.  check your blood pressure once daily for 7 days  home and send me the readings so I can determine if you need a change in  losartan dose

## 2022-10-22 NOTE — Progress Notes (Unsigned)
Subjective:  Patient ID: Dylan Haynes, male    DOB: [birth year]  Age: 52 y.o. MRN: [id number]  CC: The primary encounter diagnosis was Primary hypertension. Diagnoses of Hyperlipidemia LDL goal <70, Prostate cancer screening, Long-term use of high-risk medication, White coat syndrome with hypertension, and History of non-ST elevation myocardial infarction (NSTEMI) were also pertinent to this visit.   HPI Laiken B Mangel presents for  Chief Complaint  Patient presents with   Medical Management of Chronic Issues    6 month follow up on hypertension and hyperlipidemia   1) HTN: carvedilol was increased  NOv 30 by cardiologist for goal bp 120/80 or .  Has not been checking BP at home,  but tolerating medications currently without side effects.    2) HLD: LDL was < 70 on crestor 40 mg   in July   3) CAD: with histor  of NSTEMI 2020 distal RCA occlusion .  He is active physically without angina   Outpatient Medications Prior to Visit  Medication Sig Dispense Refill   ASPIRIN LOW DOSE 81 MG chewable tablet CHEW 1 TABLET BY MOUTH EVERY DAY 90 tablet 1   carvedilol (COREG) 12.5 MG tablet Take 1 tablet (12.5 mg total) by mouth 2 (two) times daily with a meal. 180 tablet 3   losartan-hydrochlorothiazide (HYZAAR) 50-12.5 MG tablet TAKE 1 TABLET BY MOUTH EVERY DAY 90 tablet 1   rosuvastatin (CRESTOR) 40 MG tablet TAKE 1 TABLET BY MOUTH EVERY DAY 90 tablet 0   No facility-administered medications prior to visit.    Review of Systems;  Patient denies headache, fevers, malaise, unintentional weight loss, skin rash, eye pain, sinus congestion and sinus pain, sore throat, dysphagia,  hemoptysis , cough, dyspnea, wheezing, chest pain, palpitations, orthopnea, edema, abdominal pain, nausea, melena, diarrhea, constipation, flank pain, dysuria, hematuria, urinary  Frequency, nocturia, numbness, tingling, seizures,  Focal weakness, Loss of consciousness,  Tremor, insomnia, depression, anxiety, and  suicidal ideation.      Objective:  BP 128/82   Pulse 75   Temp 98.1 F (36.7 C) (Oral)   Ht '6\' 5"'$  (1.956 m)   Wt 246 lb 3.2 oz (111.7 kg)   SpO2 96%   BMI 29.20 kg/m   BP Readings from Last 3 Encounters:  10/22/22 128/82  08/28/22 (!) 132/94  04/21/22 (!) 146/92    Wt Readings from Last 3 Encounters:  10/22/22 246 lb 3.2 oz (111.7 kg)  08/28/22 250 lb (113.4 kg)  04/21/22 247 lb 12.8 oz (112.4 kg)    Physical Exam  Lab Results  Component Value Date   HGBA1C 5.9 04/21/2022   HGBA1C 5.7 04/18/2021   HGBA1C 5.4 12/29/2018    Lab Results  Component Value Date   CREATININE 1.17 04/21/2022   CREATININE 1.25 10/21/2021   CREATININE 1.23 04/18/2021    Lab Results  Component Value Date   WBC 7.8 10/21/2021   HGB 16.7 10/21/2021   HCT 49.7 10/21/2021   PLT 188.0 10/21/2021   GLUCOSE 77 04/21/2022   CHOL 122 04/21/2022   TRIG 71 04/21/2022   HDL 45 04/21/2022   LDLDIRECT 188.4 12/29/2012   LDLCALC 62 04/21/2022   ALT 38 04/21/2022   AST 25 04/21/2022   NA 139 04/21/2022   K 3.7 04/21/2022   CL 102 04/21/2022   CREATININE 1.17 04/21/2022   BUN 12 04/21/2022   CO2 31 04/21/2022   TSH 1.86 12/15/2018   PSA 1.58 10/21/2021   HGBA1C 5.9 04/21/2022   MICROALBUR 1.0  04/21/2022    DG Wrist Complete Right  Result Date: 01/13/2022 CLINICAL DATA:  Trauma the right wrist. EXAM: RIGHT WRIST - COMPLETE 3+ VIEW COMPARISON:  None. FINDINGS: There is no evidence of fracture or dislocation. There is no evidence of arthropathy or other focal bone abnormality. Soft tissues are unremarkable. IMPRESSION: Negative. Electronically Signed   By: Anner Crete M.D.   On: 01/13/2022 20:21    Assessment & Plan:  .Primary hypertension -     Comprehensive metabolic panel  Hyperlipidemia LDL goal <70 Assessment & Plan: He is tolerating hi dose crestor.  Repeat lipids are due ,  Orders: -     Lipid panel -     LDL cholesterol, direct -     Comprehensive metabolic  panel  Prostate cancer screening -     PSA  Long-term use of high-risk medication -     CBC with Differential/Platelet -     TSH  White coat syndrome with hypertension Assessment & Plan: Currently taking carvedilol 12.5 mg bid and losarta 50/12.5  once daily.  he reports compliance with medication regimen  but has an elevated reading today in office.  He has been asked to check his BP at work and  submit readings for evaluation. 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  S/p stent with resolution to TIMI 0 .  Has fiished use of  Brilinta for one year,  continues to take  Statin and beta blocker .  EF normal,  No WMA,  And   LV hypertrophy with Grade I  diastolic dysfunction noted on ECHO  continue Follow up with cardiology every 6 months      I provided 30 minutes of face-to-face time during this encounter reviewing patient's last visit with me, patient's  most recent visit with cardiology,  nephrology,  and neurology,  recent surgical and non surgical procedures, previous  labs and imaging studies, counseling on currently addressed issues,  and post visit ordering to diagnostics and therapeutics .   Follow-up: No follow-ups on file.   Crecencio Mc, MD

## 2022-10-22 NOTE — Assessment & Plan Note (Signed)
He is tolerating high  dose crestor.  LDL is at goal .   Lab Results  Component Value Date   CHOL 130 10/22/2022   HDL 42.40 10/22/2022   LDLCALC 74 10/22/2022   LDLDIRECT 73.0 10/22/2022   TRIG 70.0 10/22/2022   CHOLHDL 3 10/22/2022

## 2022-10-22 NOTE — Assessment & Plan Note (Signed)
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  S/p stent with resolution to TIMI 0 .  Has fiished use of  Brilinta for one year,  continues to take  Statin and beta blocker .  EF normal,  No WMA,  And   LV hypertrophy with Grade I  diastolic dysfunction noted on ECHO  continue Follow up with cardiology every 6 months

## 2022-10-23 NOTE — Assessment & Plan Note (Signed)
Continue annual screening with CBC   Lab Results  Component Value Date   WBC 6.1 10/22/2022   HGB 15.5 10/22/2022   HCT 46.2 10/22/2022   MCV 90.4 10/22/2022   PLT 185.0 10/22/2022

## 2023-01-17 ENCOUNTER — Other Ambulatory Visit: Payer: Self-pay | Admitting: Internal Medicine

## 2023-01-27 ENCOUNTER — Other Ambulatory Visit (INDEPENDENT_AMBULATORY_CARE_PROVIDER_SITE_OTHER): Payer: BC Managed Care – PPO

## 2023-01-27 DIAGNOSIS — R7989 Other specified abnormal findings of blood chemistry: Secondary | ICD-10-CM | POA: Diagnosis not present

## 2023-01-27 LAB — HEPATIC FUNCTION PANEL
ALT: 41 U/L (ref 0–53)
AST: 26 U/L (ref 0–37)
Albumin: 4.2 g/dL (ref 3.5–5.2)
Alkaline Phosphatase: 54 U/L (ref 39–117)
Bilirubin, Direct: 0.3 mg/dL (ref 0.0–0.3)
Total Bilirubin: 1.5 mg/dL — ABNORMAL HIGH (ref 0.2–1.2)
Total Protein: 6.9 g/dL (ref 6.0–8.3)

## 2023-02-23 ENCOUNTER — Other Ambulatory Visit: Payer: Self-pay | Admitting: Internal Medicine

## 2023-02-23 DIAGNOSIS — I10 Essential (primary) hypertension: Secondary | ICD-10-CM

## 2023-04-14 ENCOUNTER — Other Ambulatory Visit: Payer: Self-pay | Admitting: Internal Medicine

## 2023-04-23 ENCOUNTER — Ambulatory Visit (INDEPENDENT_AMBULATORY_CARE_PROVIDER_SITE_OTHER): Payer: 59 | Admitting: Internal Medicine

## 2023-04-23 ENCOUNTER — Encounter: Payer: Self-pay | Admitting: Internal Medicine

## 2023-04-23 VITALS — BP 128/78 | HR 84 | Temp 97.9°F | Ht 77.0 in | Wt 245.4 lb

## 2023-04-23 DIAGNOSIS — Z0000 Encounter for general adult medical examination without abnormal findings: Secondary | ICD-10-CM

## 2023-04-23 DIAGNOSIS — I10 Essential (primary) hypertension: Secondary | ICD-10-CM

## 2023-04-23 DIAGNOSIS — E663 Overweight: Secondary | ICD-10-CM

## 2023-04-23 DIAGNOSIS — E785 Hyperlipidemia, unspecified: Secondary | ICD-10-CM

## 2023-04-23 LAB — MICROALBUMIN / CREATININE URINE RATIO
Creatinine,U: 297.9 mg/dL
Microalb Creat Ratio: 0.4 mg/g (ref 0.0–30.0)
Microalb, Ur: 1.2 mg/dL (ref 0.0–1.9)

## 2023-04-23 LAB — COMPREHENSIVE METABOLIC PANEL
ALT: 37 U/L (ref 0–53)
AST: 25 U/L (ref 0–37)
Albumin: 4.7 g/dL (ref 3.5–5.2)
Alkaline Phosphatase: 56 U/L (ref 39–117)
BUN: 13 mg/dL (ref 6–23)
CO2: 31 mEq/L (ref 19–32)
Calcium: 10 mg/dL (ref 8.4–10.5)
Chloride: 101 mEq/L (ref 96–112)
Creatinine, Ser: 1.26 mg/dL (ref 0.40–1.50)
GFR: 65 mL/min (ref >60.00)
Glucose, Bld: 82 mg/dL (ref 70–99)
Potassium: 4 mEq/L (ref 3.5–5.1)
Sodium: 140 mEq/L (ref 135–145)
Total Bilirubin: 1.7 mg/dL — ABNORMAL HIGH (ref 0.2–1.2)
Total Protein: 7.7 g/dL (ref 6.0–8.3)

## 2023-04-23 LAB — LIPID PANEL
Cholesterol: 127 mg/dL (ref 0–200)
HDL: 43.2 mg/dL (ref >39.00)
LDL Cholesterol: 72 mg/dL (ref 0–99)
NonHDL: 83.87
Total CHOL/HDL Ratio: 3
Triglycerides: 57 mg/dL (ref 0.0–149.0)
VLDL: 11.4 mg/dL (ref 0.0–40.0)

## 2023-04-23 LAB — LDL CHOLESTEROL, DIRECT: Direct LDL: 72 mg/dL

## 2023-04-23 LAB — HEMOGLOBIN A1C: Hgb A1c MFr Bld: 5.8 % (ref 4.6–6.5)

## 2023-04-23 NOTE — Patient Instructions (Addendum)
All fruit diets do not give you B12 or protein,  so don't do it for more than a few weeks,  or add SOME form of animal protein   Here are some  low carb "on the go" breakfast  options :  1) try the premixed protein drinks (Premier Atkins, AdvantEdge and the best tasting , highest protein one available is called " Premier Protein"; it is advocated by the bariatric surgeons for their patients and  available of < $2 serving at [facility] and  In bulk for $1.50/serving at [employer] and [employer]  .    Nutritional analysis :  160 cal  30 g protein  1 g sugar 50% calcium needs   Nicolette Bang and [employer]  2)  "Veggies Made Great !" Makes a low carb spinach & egg white frittata     Vilma Meckel now makes a frozen breakfast frittata that can be microwaved in 2 minutes and is very low carb. John Giovanni are similar to quiches without the crust   3)  [employer] "low carb power bowl"  entrees and  "Steamer" entrees are are great low carb entrees that microwave in 5 minutes

## 2023-04-23 NOTE — Assessment & Plan Note (Addendum)
Reviewed diet which is full of hidden carbohydrates.  Addressed his concerns about an "all fruit diet" which he is planning to do for a month. Protien supplementation advised.

## 2023-04-23 NOTE — Progress Notes (Signed)
Patient ID: Dylan Haynes, male    DOB: Mar 09, 1971  Age: 51 y.o. MRN: [id number]  The patient is here for annual preventive examination and management of other chronic and acute problems.   The risk factors are reflected in the social history.   The roster of all physicians providing medical care to patient - is listed in the Snapshot section of the chart.   Activities of daily living:  The patient is 100% independent in all ADLs: dressing, toileting, feeding as well as independent mobility   Home safety : The patient has smoke detectors in the home. They wear seatbelts.  There are no unsecured firearms at home. There is no violence in the home.    There is no risks for hepatitis, STDs or HIV. There is no   history of blood transfusion. They have no travel history to infectious disease endemic areas of the world.   The patient has seen their dentist in the last six month. They have seen their eye doctor in the last year. The patinet  denies slight hearing difficulty with regard to whispered voices and some television programs.  They have deferred audiologic testing in the last year.  They do not  have excessive sun exposure. Discussed the need for sun protection: hats, long sleeves and use of sunscreen if there is significant sun exposure.    Diet: the importance of a healthy diet is discussed. They do have a healthy diet.   The benefits of regular aerobic exercise were discussed. The patient  exercises  3 to 5 days per week  for  60 minutes.    Depression screen: there are no signs or vegative symptoms of depression- irritability, change in appetite, anhedonia, sadness/tearfullness.   The following portions of the patient's history were reviewed and updated as appropriate: allergies, current medications, past family history, past medical history,  past surgical history, past social history  and problem list.   Visual acuity was not assessed per patient preference since the patient has  regular follow up with an  ophthalmologist. Hearing and body mass index were assessed and reviewed.    During the course of the visit the patient was educated and counseled about appropriate screening and preventive services including : fall prevention , diabetes screening, nutrition counseling, colorectal cancer screening, and recommended immunizations.    Chief Complaint:  1) overweight:  starting walking 2 miles daily 2 weeks ago.    Review of Symptoms  Patient denies headache, fevers, malaise, unintentional weight loss, skin rash, eye pain, sinus congestion and sinus pain, sore throat, dysphagia,  hemoptysis , cough, dyspnea, wheezing, chest pain, palpitations, orthopnea, edema, abdominal pain, nausea, melena, diarrhea, constipation, flank pain, dysuria, hematuria, urinary  Frequency, nocturia, numbness, tingling, seizures,  Focal weakness, Loss of consciousness,  Tremor, insomnia, depression, anxiety, and suicidal ideation.    Physical Exam: BP 128/78   Pulse 84   Temp 97.9 F (36.6 C) (Oral)   Ht 6\' 5"  (1.956 m)   Wt 245 lb 6.4 oz (111.3 kg)   SpO2 98%   BMI 29.10 kg/m    Physical Exam Vitals reviewed.  Constitutional:      General: He is not in acute distress.    Appearance: Normal appearance. He is normal weight. He is not ill-appearing, toxic-appearing or diaphoretic.  HENT:     Head: Normocephalic and atraumatic.     Right Ear: Tympanic membrane, ear canal and external ear normal. There is no impacted cerumen.     Left  Ear: Tympanic membrane, ear canal and external ear normal. There is no impacted cerumen.     Nose: Nose normal.     Mouth/Throat:     Mouth: Mucous membranes are moist.     Pharynx: Oropharynx is clear.  Eyes:     General: No scleral icterus.       Right eye: No discharge.        Left eye: No discharge.     Conjunctiva/sclera: Conjunctivae normal.  Neck:     Thyroid: No thyromegaly.     Vascular: No carotid bruit or JVD.  Cardiovascular:     Rate  and Rhythm: Normal rate and regular rhythm.     Heart sounds: Normal heart sounds.  Pulmonary:     Effort: Pulmonary effort is normal. No respiratory distress.     Breath sounds: Normal breath sounds.  Abdominal:     General: Bowel sounds are normal.     Palpations: Abdomen is soft. There is no mass.     Tenderness: There is no abdominal tenderness. There is no guarding or rebound.  Musculoskeletal:        General: Normal range of motion.     Cervical back: Normal range of motion and neck supple.  Lymphadenopathy:     Cervical: No cervical adenopathy.  Skin:    General: Skin is warm and dry.  Neurological:     General: No focal deficit present.     Mental Status: He is alert and oriented to person, place, and time. Mental status is at baseline.  Psychiatric:        Mood and Affect: Mood normal.        Behavior: Behavior normal.        Thought Content: Thought content normal.        Judgment: Judgment normal.    Assessment and Plan: Encounter for preventive health examination Assessment & Plan: age appropriate education and counseling updated, referrals for preventative services and immunizations addressed, dietary and smoking counseling addressed, most recent labs reviewed.  I have personally reviewed and have noted:   1) the patient's medical and social history 2) The pt's use of alcohol, tobacco, and illicit drugs 3) The patient's current medications and supplements 4) Functional ability including ADL's, fall risk, home safety risk, hearing and visual impairment 5) Diet and physical activities 6) Evidence for depression or mood disorder 7) The patient's height, weight, and BMI have been recorded in the chart    I have made referrals, and provided counseling and education based on review of the above    Hyperlipidemia LDL goal <70 Assessment & Plan: He is tolerating high  dose crestor.  LDL is at goal .   Lab Results  Component Value Date   CHOL 127 04/23/2023   HDL  43.20 04/23/2023   LDLCALC 72 04/23/2023   LDLDIRECT 72.0 04/23/2023   TRIG 57.0 04/23/2023   CHOLHDL 3 04/23/2023     Orders: -     Lipid panel -     LDL cholesterol, direct -     Hemoglobin A1c  Primary hypertension -     Comprehensive metabolic panel -     Microalbumin / creatinine urine ratio  Overweight (BMI 25.0-29.9) Assessment & Plan: Reviewed diet which is full of hidden carbohydrates.  Addressed his concerns about an "all fruit diet" which he is planning to do for a month. Protien supplementation advised.    White coat syndrome with hypertension Assessment & Plan: Currently taking carvedilol 12.5  mg bid and losartav 50/12.5  once daily.  he reports compliance with medication regimen.  Review of home readings done correctly on a validated machine,  are < 130/80.  No changes today     Return in about 6 months (around 10/24/2023).  Sherlene Shams, MD

## 2023-04-25 NOTE — Assessment & Plan Note (Signed)

## 2023-04-25 NOTE — Assessment & Plan Note (Addendum)
Currently taking carvedilol 12.5 mg bid and losartav 50/12.5  once daily.  he reports compliance with medication regimen.  Review of home readings done correctly on a validated machine,  are < 130/80.  No changes today

## 2023-04-25 NOTE — Assessment & Plan Note (Signed)
He is tolerating high  dose crestor.  LDL is at goal .   Lab Results  Component Value Date   CHOL 127 04/23/2023   HDL 43.20 04/23/2023   LDLCALC 72 04/23/2023   LDLDIRECT 72.0 04/23/2023   TRIG 57.0 04/23/2023   CHOLHDL 3 04/23/2023

## 2023-08-16 ENCOUNTER — Other Ambulatory Visit: Payer: Self-pay | Admitting: Internal Medicine

## 2023-08-16 DIAGNOSIS — I10 Essential (primary) hypertension: Secondary | ICD-10-CM

## 2023-08-17 ENCOUNTER — Other Ambulatory Visit: Payer: Self-pay | Admitting: Internal Medicine

## 2023-08-23 NOTE — Progress Notes (Signed)
Cardiology office note    Date:  08/24/2023   ID:  Dylan Haynes, DOB [birth year], MRN [id number]  PCP:  Sherlene Shams, MD  Cardiologist:  Thurmon Fair, MD  Electrophysiologist:  None   Evaluation Performed:  Follow-Up Visit  Chief Complaint:  F/U CAD  History of Present Illness:    Dylan Haynes is a 52 y.o. male with hypercholesterolemia and essential hypertension who presented with non-STEMI due to occlusion of the distal right coronary artery in April 2020 treated with urgent PCI (2.5 x 12 mm resolute DES).  He also had evidence of mild stenoses in the mid RCA and right posterior lateral ventricular branch, but no significant lesions in the left coronary system.  The echocardiogram performed the day following his infarction showed normal LVEF and wall motion with moderate LVH.  There was evidence of moderate diastolic dysfunction both by LVEDP measurement and echo parameters.  He continues to do well and has no symptoms of active cardiac illness.  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  Occasionally he will run across the parking lot and does not have angina or dyspnea.  He finds it hard to engage in other scheduled exercise, due to his busy work schedule and the fact that they have 6 boys at home.  His sleep is often interrupted by the 61-year-old.  The patient specifically denies any chest pain at rest or with exertion, dyspnea at rest or with exertion, orthopnea, paroxysmal nocturnal dyspnea, syncope, palpitations, focal neurological deficits, intermittent claudication, lower extremity edema, unexplained weight gain, cough, hemoptysis or wheezing.  He remains overweight with a BMI around 29, although he is also very fit and muscular.  Has some signs of insulin resistance including prediabetes (hemoglobin A1c 5.8%) and a relatively low HDL cholesterol (43).  LDL is borderline adequate at 72 (it was 61 in ago).   Past Medical History:  Diagnosis  Date   Hypertension    Past Surgical History:  Procedure Laterality Date   CORONARY STENT INTERVENTION N/A 12/29/2018   Procedure: CORONARY STENT INTERVENTION;  Surgeon: Lennette Bihari, MD;  Location: [facility];  Service: Cardiovascular;  Laterality: N/A;   cyst removal in back     LEFT HEART CATH AND CORONARY ANGIOGRAPHY N/A 12/29/2018   Procedure: LEFT HEART CATH AND CORONARY ANGIOGRAPHY;  Surgeon: Lennette Bihari, MD;  Location: [facility];  Service: Cardiovascular;  Laterality: N/A;     Current Meds  Medication Sig   ASPIRIN LOW DOSE 81 MG chewable tablet CHEW 1 TABLET BY MOUTH EVERY DAY   carvedilol (COREG) 12.5 MG tablet TAKE 1 TABLET (12.5MG  TOTAL) BY MOUTH TWICE A DAY WITH MEALS   losartan-hydrochlorothiazide (HYZAAR) 50-12.5 MG tablet TAKE 1 TABLET BY MOUTH EVERY DAY   rosuvastatin (CRESTOR) 40 MG tablet TAKE 1 TABLET BY MOUTH EVERY DAY     Allergies:   Patient has no known allergies.   Social History   Tobacco Use   Smoking status: Former    Types: Cigars   Smokeless tobacco: Never  Substance Use Topics   Alcohol use: No   Drug use: No     Family Hx: The patient's family history includes Cancer (age of onset: 23) in his maternal grandmother; Heart disease in his paternal uncle; Hypertension in his mother; Stroke in his mother. There is no history of Drug abuse. He was adopted.  ROS:   Please see the history of present illness.  All other systems reviewed and are negative.   Prior CV studies:   The following studies were reviewed today:  Labs/Other Tests and Data Reviewed:    EKG:   EKG Interpretation Date/Time:  Monday August 24 2023 10:39:56 EST Ventricular Rate:  66 PR Interval:  146 QRS Duration:  80 QT Interval:  382 QTC Calculation: 400 R Axis:   33  Text Interpretation: Normal sinus rhythm Normal ECG When compared with ECG of 30-Dec-2018 06:48, T wave inversion less evident in Inferior leads Confirmed by Govind Furey ([id number])  on 08/24/2023 12:32:09 PM          Cardiac catheterization on 12/29/2018  Mid RCA lesion is 20% stenosed. Post Atrio lesion is 100% stenosed. Post intervention, there is a 0% residual stenosis. 1st RPLB lesion is 30% stenosed. There is mild left ventricular systolic dysfunction. LV end diastolic pressure is mildly elevated. There is no mitral valve regurgitation. A stent was successfully placed.   Acute coronary syndrome secondary to total occlusion of the distal RCA immediately after a small PDA vessel with initial TIMI 0 flow.  There was  faint left to right collateralization dominantly from the LAD.   Normal left coronary circulation with a large LAD, and left circumflex coronary artery giving rise to a large bifurcating OM1 branch, a diminutive OM 2 vessel distal vessel supplying a portion of the posterior lateral wall.   Mild acute LV dysfunction with an EF of 50% with mild mid inferior hypocontractility.  LVEDP mildly increased at 20 mm.   Successful PCI to the totally occluded RCA with insertion of a 2.5 x 12 mm Resolute DES stent postdilated to 3.7 mm with 100% occlusion being reduced to 0%.  There was mild haziness at the ostium of the inferolateral branch suggesting mild thrombus and there was brisk TIMI-3 flow down the large PLA vessel.  Aggrastat bolus plus infusion was administered with plans to continue Aggrastat for 18 hours post PCI.   RECOMMENDATION: DAPT for minimum of 1 year.  Aggressive lipid-lowering therapy with consideration for PCSK9 inhibition in addition to high potency statin/ezetamide with target LDL less than 70.  Optimal blood pressure control with ideal blood pressure less than 120/80.  Diagnostic Dominance: Right  Intervention    Implants   Permanent Stent  Stent Resolute Onyx 2.5x12       ECHOCARDIOGRAM 12/30/2018   1. The left ventricle has normal systolic function with an ejection fraction of 60-65%. The cavity size was normal. There is  moderately increased left ventricular wall thickness. Left ventricular diastolic Doppler parameters are consistent with impaired  relaxation. Indeterminate filling pressures The E/e' is 8-15. No evidence of left ventricular regional wall motion abnormalities.  2. The right ventricle has normal systolic function. The cavity was normal. There is no increase in right ventricular wall thickness.  3. The mitral valve is grossly normal.  4. The tricuspid valve is grossly normal.  5. The aortic valve is tricuspid.  6. The aortic root and ascending aorta are normal in size and structure.    Recent Labs: 10/22/2022: Hemoglobin 15.5; Platelets 185.0; TSH 1.50 04/23/2023: ALT 37; BUN 13; Creatinine, Ser 1.26; Potassium 4.0; Sodium 140   Recent Lipid Panel Lab Results  Component Value Date/Time   CHOL 127 04/23/2023 09:58 AM   CHOL 103 02/04/2019 09:25 AM   TRIG 57.0 04/23/2023 09:58 AM   HDL 43.20 04/23/2023 09:58 AM   HDL 36 (L) 02/04/2019 09:25 AM   CHOLHDL 3 04/23/2023 09:58 AM  LDLCALC 72 04/23/2023 09:58 AM   LDLCALC 62 04/21/2022 10:27 AM   LDLDIRECT 72.0 04/23/2023 09:58 AM    Wt Readings from Last 3 Encounters:  08/24/23 246 lb 6.4 oz (111.8 kg)  04/23/23 245 lb 6.4 oz (111.3 kg)  10/22/22 246 lb 3.2 oz (111.7 kg)          Objective:    Vital Signs:  BP 136/84   Pulse 66   Ht 6\' 5"  (1.956 m)   Wt 246 lb 6.4 oz (111.8 kg)   SpO2 95%   BMI 29.22 kg/m    General: Alert, oriented x3, no distress, overweight, but also appears very muscular and fit Head: no evidence of trauma, PERRL, EOMI, no exophtalmos or lid lag, no myxedema, no xanthelasma; normal ears, nose and oropharynx Neck: normal jugular venous pulsations and no hepatojugular reflux; brisk carotid pulses without delay and no carotid bruits Chest: clear to auscultation, no signs of consolidation by percussion or palpation, normal fremitus, symmetrical and full respiratory excursions Cardiovascular: normal position  and quality of the apical impulse, regular rhythm, normal first and second heart sounds, no murmurs, rubs or gallops Abdomen: no tenderness or distention, no masses by palpation, no abnormal pulsatility or arterial bruits, normal bowel sounds, no hepatosplenomegaly Extremities: no clubbing, cyanosis or edema; 2+ radial, ulnar and brachial pulses bilaterally; 2+ right femoral, posterior tibial and dorsalis pedis pulses; 2+ left femoral, posterior tibial and dorsalis pedis pulses; no subclavian or femoral bruits Neurological: grossly nonfocal Psych: Normal mood and affect   ASSESSMENT & PLAN:    1. Coronary artery disease involving native coronary artery of native heart without angina pectoris   2. Essential hypertension   3. Hyperlipidemia LDL goal <70       CAD: Asymptomatic.  On aspirin, high-dose statin, beta-blocker. HTN: He always tends to run a rather high diastolic blood pressure.  No changes made to his medications today. HLP: Both LDL and HDL have deteriorated a little bit since last year.  Encouraged him to try to lose 10 pounds and exercise more regularly.  Patient Instructions  Medication Instructions:  No changes *If you need a refill on your cardiac medications before your next appointment, please call your pharmacy*  Follow-Up: At [facility], you and your health needs are our priority.  As part of our continuing mission to provide you with exceptional heart care, we have created designated Provider Care Teams.  These Care Teams include your primary Cardiologist (physician) and Advanced Practice Providers (APPs -  Physician Assistants and Nurse Practitioners) who all work together to provide you with the care you need, when you need it.  We recommend signing up for the patient portal called "MyChart".  Sign up information is provided on this After Visit Summary.  MyChart is used to connect with patients for Virtual Visits (Telemedicine).  Patients are able to view  lab/test results, encounter notes, upcoming appointments, etc.  Non-urgent messages can be sent to your provider as well.   To learn more about what you can do with MyChart, go to [email or web address].    Your next appointment:   1 year(s)  Provider:   Thurmon Fair, MD        Signed, Thurmon Fair, MD  08/24/2023 12:36 PM     [facility]

## 2023-08-24 ENCOUNTER — Ambulatory Visit: Payer: 59 | Attending: Cardiovascular Disease | Admitting: Cardiovascular Disease

## 2023-08-24 ENCOUNTER — Encounter: Payer: Self-pay | Admitting: Cardiovascular Disease

## 2023-08-24 VITALS — BP 136/84 | HR 66 | Ht 77.0 in | Wt 246.4 lb

## 2023-08-24 DIAGNOSIS — E785 Hyperlipidemia, unspecified: Secondary | ICD-10-CM | POA: Diagnosis not present

## 2023-08-24 DIAGNOSIS — I10 Essential (primary) hypertension: Secondary | ICD-10-CM

## 2023-08-24 DIAGNOSIS — I2510 Atherosclerotic heart disease of native coronary artery without angina pectoris: Secondary | ICD-10-CM

## 2023-08-24 NOTE — Patient Instructions (Signed)

## 2023-10-28 ENCOUNTER — Ambulatory Visit: Payer: 59 | Admitting: Internal Medicine

## 2023-10-28 ENCOUNTER — Encounter: Payer: Self-pay | Admitting: Internal Medicine

## 2023-10-28 VITALS — BP 120/70 | HR 80 | Temp 99.0°F | Wt 246.0 lb

## 2023-10-28 DIAGNOSIS — R110 Nausea: Secondary | ICD-10-CM

## 2023-10-28 DIAGNOSIS — I252 Old myocardial infarction: Secondary | ICD-10-CM

## 2023-10-28 DIAGNOSIS — I10 Essential (primary) hypertension: Secondary | ICD-10-CM | POA: Diagnosis not present

## 2023-10-28 DIAGNOSIS — E785 Hyperlipidemia, unspecified: Secondary | ICD-10-CM

## 2023-10-28 LAB — COMPREHENSIVE METABOLIC PANEL
ALT: 37 U/L (ref 0–53)
AST: 26 U/L (ref 0–37)
Albumin: 4.7 g/dL (ref 3.5–5.2)
Alkaline Phosphatase: 55 U/L (ref 39–117)
BUN: 15 mg/dL (ref 6–23)
CO2: 31 meq/L (ref 19–32)
Calcium: 9.6 mg/dL (ref 8.4–10.5)
Chloride: 103 meq/L (ref 96–112)
Creatinine, Ser: 1.26 mg/dL (ref 0.40–1.50)
GFR: 64.77 mL/min (ref >60.00)
Glucose, Bld: 92 mg/dL (ref 70–99)
Potassium: 4 meq/L (ref 3.5–5.1)
Sodium: 141 meq/L (ref 135–145)
Total Bilirubin: 1.5 mg/dL — ABNORMAL HIGH (ref 0.2–1.2)
Total Protein: 7.8 g/dL (ref 6.0–8.3)

## 2023-10-28 LAB — URINALYSIS, ROUTINE W REFLEX MICROSCOPIC
Bilirubin Urine: NEGATIVE
Hgb urine dipstick: NEGATIVE
Leukocytes,Ua: NEGATIVE
Nitrite: NEGATIVE
Specific Gravity, Urine: >=1.03 — AB (ref 1.000–1.030)
Total Protein, Urine: 30 — AB
Urine Glucose: NEGATIVE
Urobilinogen, UA: 0.2 (ref 0.0–1.0)
pH: 6 (ref 5.0–8.0)

## 2023-10-28 LAB — LDL CHOLESTEROL, DIRECT: Direct LDL: 77 mg/dL

## 2023-10-28 LAB — LIPID PANEL
Cholesterol: 137 mg/dL (ref 0–200)
HDL: 50.5 mg/dL (ref >39.00)
LDL Cholesterol: 72 mg/dL (ref 0–99)
NonHDL: 86.57
Total CHOL/HDL Ratio: 3
Triglycerides: 73 mg/dL (ref 0.0–149.0)
VLDL: 14.6 mg/dL (ref 0.0–40.0)

## 2023-10-28 MED ORDER — ONDANSETRON 4 MG PO TBDP: 4.0000 mg | ORAL_TABLET | Freq: Three times a day (TID) | ORAL | 0 refills | Status: DC | PRN

## 2023-10-28 MED ORDER — LOSARTAN POTASSIUM 100 MG PO TABS: 100.0000 mg | ORAL_TABLET | Freq: Every day | ORAL | 1 refills | Status: DC

## 2023-10-28 NOTE — Progress Notes (Addendum)
Subjective:  Patient ID: Dylan Haynes, male    DOB: 25-Apr-1971  Age: 53 y.o. MRN: [id number]  CC: The primary encounter diagnosis was Hyperlipidemia LDL goal <70. Diagnoses of Primary hypertension, Nausea, White coat syndrome with hypertension, and History of non-ST elevation myocardial infarction (NSTEMI) were also pertinent to this visit.   HPI Dylan Haynes presents for  Chief Complaint  Patient presents with   Medical Management of Chronic Issues    6 month follow up    Mr Minder presented for 6 month follow up on hypertension and hyperlipidemia,  and as  but developed nausea and cold chills during his intake . CBG was checked and 61,  he was fasting for labs.   He denies shortness of breath,  chest pain and dysuria.  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     Outpatient Medications Prior to Visit  Medication Sig Dispense Refill   ASPIRIN LOW DOSE 81 MG chewable tablet CHEW 1 TABLET BY MOUTH EVERY DAY 90 tablet 1   carvedilol (COREG) 12.5 MG tablet TAKE 1 TABLET (12.5MG  TOTAL) BY MOUTH TWICE A DAY WITH MEALS 180 tablet 3   rosuvastatin (CRESTOR) 40 MG tablet TAKE 1 TABLET BY MOUTH EVERY DAY 90 tablet 3   losartan-hydrochlorothiazide (HYZAAR) 50-12.5 MG tablet TAKE 1 TABLET BY MOUTH EVERY DAY 90 tablet 1   No facility-administered medications prior to visit.    Review of Systems;  Patient denies headache, fevers, malaise, unintentional weight loss, skin rash, eye pain, sinus congestion and sinus pain, sore throat, dysphagia,  hemoptysis , cough, dyspnea, wheezing, chest pain, palpitations, orthopnea, edema, abdominal pain, nausea, melena, diarrhea, constipation, flank pain, dysuria, hematuria, urinary  Frequency, nocturia, numbness, tingling, seizures,  Focal weakness, Loss of consciousness,  Tremor, insomnia, depression, anxiety, and suicidal ideation.      Objective:  BP 120/70   Pulse 80   Temp 99 F (37.2 C)   Wt 246 lb (111.6 kg)   BMI  29.17 kg/m   BP Readings from Last 3 Encounters:  10/31/23 120/70  08/24/23 136/84  04/23/23 128/78    Wt Readings from Last 3 Encounters:  10/31/23 246 lb (111.6 kg)  08/24/23 246 lb 6.4 oz (111.8 kg)  04/23/23 245 lb 6.4 oz (111.3 kg)    Physical Exam Vitals reviewed.  Constitutional:      General: He is not in acute distress.    Appearance: Normal appearance. He is normal weight. He is not ill-appearing, toxic-appearing or diaphoretic.  HENT:     Head: Normocephalic.  Eyes:     General: No scleral icterus.       Right eye: No discharge.        Left eye: No discharge.     Conjunctiva/sclera: Conjunctivae normal.  Cardiovascular:     Rate and Rhythm: Normal rate and regular rhythm.     Heart sounds: Normal heart sounds.  Pulmonary:     Effort: Pulmonary effort is normal. No respiratory distress.     Breath sounds: Normal breath sounds.  Musculoskeletal:        General: Normal range of motion.     Cervical back: Normal range of motion.  Skin:    General: Skin is warm and dry.  Neurological:     General: No focal deficit present.     Mental Status: He is alert and oriented to person, place, and time. Mental status is at baseline.  Psychiatric:  Mood and Affect: Mood normal.        Behavior: Behavior normal.        Thought Content: Thought content normal.        Judgment: Judgment normal.    Lab Results  Component Value Date   HGBA1C 5.8 04/23/2023   HGBA1C 5.9 04/21/2022   HGBA1C 5.7 04/18/2021    Lab Results  Component Value Date   CREATININE 1.26 10/28/2023   CREATININE 1.26 04/23/2023   CREATININE 1.10 10/22/2022    Lab Results  Component Value Date   WBC 6.1 10/22/2022   HGB 15.5 10/22/2022   HCT 46.2 10/22/2022   PLT 185.0 10/22/2022   GLUCOSE 92 10/28/2023   CHOL 137 10/28/2023   TRIG 73.0 10/28/2023   HDL 50.50 10/28/2023   LDLDIRECT 77.0 10/28/2023   LDLCALC 72 10/28/2023   ALT 37 10/28/2023   AST 26 10/28/2023   NA 141  10/28/2023   K 4.0 10/28/2023   CL 103 10/28/2023   CREATININE 1.26 10/28/2023   BUN 15 10/28/2023   CO2 31 10/28/2023   TSH 1.50 10/22/2022   PSA 1.59 10/22/2022   HGBA1C 5.8 04/23/2023   MICROALBUR 1.2 04/23/2023    DG Wrist Complete Right Result Date: 01/13/2022 CLINICAL DATA:  Trauma the right wrist. EXAM: RIGHT WRIST - COMPLETE 3+ VIEW COMPARISON:  None. FINDINGS: There is no evidence of fracture or dislocation. There is no evidence of arthropathy or other focal bone abnormality. Soft tissues are unremarkable. IMPRESSION: Negative. Electronically Signed   By: Elgie Collard M.D.   On: 01/13/2022 20:21    Assessment & Plan:  .Hyperlipidemia LDL goal <70 Assessment & Plan: He is tolerating high  dose crestor.  LDL is not << 70   recommending additional treatment with Zetia    Lab Results  Component Value Date   CHOL 137 10/28/2023   HDL 50.50 10/28/2023   LDLCALC 72 10/28/2023   LDLDIRECT 77.0 10/28/2023   TRIG 73.0 10/28/2023   CHOLHDL 3 10/28/2023     Orders: -     Comprehensive metabolic panel -     Lipid panel -     LDL cholesterol, direct  Primary hypertension -     Comprehensive metabolic panel  Nausea Assessment & Plan: UTI ruled out.  Likely a viral syndrome given stable liver enzymes.  Orders: -     Urinalysis, Routine w reflex microscopic -     Urine Culture  White coat syndrome with hypertension Assessment & Plan: Given the presentation today c/w impending viral GI illness, I am dcing losartan/hct and prescribing 100 mg losartan to use (to avoid dehydration)   History of non-ST elevation myocardial infarction (NSTEMI) Assessment & Plan: Secondary to 100% occlusion of  distal RCA in 2020.  S/p stent with resolution to TIMI 0 .  Has fiished use of  Brilinta for one year,  continues to take  Statin , ARB and beta blocker .  EF normal,  No WMA,  And   LV hypertrophy with Grade I  diastolic dysfunction noted on ECHO  continue Follow up with cardiology  every 6 months   Other orders -     Losartan Potassium; Take 1 tablet (100 mg total) by mouth daily.  Dispense: 90 tablet; Refill: 1 -     Ondansetron; Take 1 tablet (4 mg total) by mouth every 8 (eight) hours as needed for nausea or vomiting.  Dispense: 20 tablet; Refill: 0 -     Ezetimibe; Take 1  tablet (10 mg total) by mouth daily.  Dispense: 90 tablet; Refill: 3      Follow-up: No follow-ups on file.   Sherlene Shams, MD

## 2023-10-28 NOTE — Assessment & Plan Note (Signed)
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  S/p stent with resolution to TIMI 0 .  Has fiished use of  Brilinta for one year,  continues to take  Statin , ARB and beta blocker .  EF normal,  No WMA,  And   LV hypertrophy with Grade I  diastolic dysfunction noted on ECHO  continue Follow up with cardiology every 6 months

## 2023-10-28 NOTE — Assessment & Plan Note (Signed)
Given the presentation today c/w impending viral GI illness, I am dcing losartan/hct and prescribing 100 mg losartan to use (to avoid dehydration)

## 2023-10-28 NOTE — Patient Instructions (Addendum)
You may be coming down with Norovirus or another GI bug given your current symptoms  I am sending zofran to your pharmacy to use if needed   You can use Imodium if you develop diarrhea  Avoid artificial sweeteners ,  roughage/raw vegetables  (salads), and greasy foods while you are recovering   I am changing your BP medication to losartan without the hydrochlorothiazide so you can continue your BP meds without getting dehydrated   Return in one month

## 2023-10-29 LAB — URINE CULTURE
MICRO NUMBER:: 16014215
Result:: NO GROWTH
SPECIMEN QUALITY:: ADEQUATE

## 2023-10-31 ENCOUNTER — Encounter: Payer: Self-pay | Admitting: Internal Medicine

## 2023-10-31 DIAGNOSIS — R110 Nausea: Secondary | ICD-10-CM | POA: Insufficient documentation

## 2023-10-31 MED ORDER — EZETIMIBE 10 MG PO TABS: 10.0000 mg | ORAL_TABLET | Freq: Every day | ORAL | 3 refills | Status: AC

## 2023-10-31 NOTE — Addendum Note (Signed)
Addended by: Sherlene Shams on: 10/31/2023 06:32 PM   Modules accepted: Orders

## 2023-10-31 NOTE — Assessment & Plan Note (Signed)
UTI ruled out.  Likely a viral syndrome given stable liver enzymes.

## 2023-10-31 NOTE — Assessment & Plan Note (Addendum)
He is tolerating high  dose crestor.  LDL is not << 70   recommending additional treatment with Zetia    Lab Results  Component Value Date   CHOL 137 10/28/2023   HDL 50.50 10/28/2023   LDLCALC 72 10/28/2023   LDLDIRECT 77.0 10/28/2023   TRIG 73.0 10/28/2023   CHOLHDL 3 10/28/2023

## 2023-11-02 ENCOUNTER — Encounter: Payer: Self-pay | Admitting: *Deleted

## 2023-11-03 ENCOUNTER — Telehealth: Payer: Self-pay

## 2023-11-03 DIAGNOSIS — E663 Overweight: Secondary | ICD-10-CM

## 2023-11-03 DIAGNOSIS — R5383 Other fatigue: Secondary | ICD-10-CM

## 2023-11-03 DIAGNOSIS — R110 Nausea: Secondary | ICD-10-CM

## 2023-11-03 NOTE — Telephone Encounter (Signed)
 Spoke with pt's wife and she has a concern about an OTC supplement that pt is taking to help with testosterone levels. The supplement is called Test X 180 Surge. Wife is wanting to know if you thought it is something that pt should be taking. She stated that she has noticed him being more fatigued and irritable lately.

## 2023-11-03 NOTE — Addendum Note (Signed)
Addended by: Sherlene Shams on: 11/03/2023 08:11 PM   Modules accepted: Orders

## 2023-11-03 NOTE — Telephone Encounter (Signed)
 Copied from CRM [id number]. Topic: Clinical - Medical Advice >> Nov 03, 2023 11:26 AM Graeme ORN wrote: Reason for CRM: Patient wife called to leave message for Dr. Marylynn. States she is usually present at appts but had a conflicting appt and was unable to make it but she has some concerns about fatigue and an otc medication patient has been taking and would like to speak with provider when she is available. Thank You

## 2023-11-05 NOTE — Telephone Encounter (Signed)
 Spoke with pt's wife to let her know and she's stated that she will have the pt give us  a call to schedule the lab appt.

## 2023-11-27 ENCOUNTER — Ambulatory Visit: Payer: 59 | Admitting: Cardiovascular Disease

## 2024-02-16 ENCOUNTER — Other Ambulatory Visit: Payer: Self-pay | Admitting: Internal Medicine

## 2024-02-17 ENCOUNTER — Other Ambulatory Visit: Payer: Self-pay | Admitting: Internal Medicine

## 2024-02-17 DIAGNOSIS — I10 Essential (primary) hypertension: Secondary | ICD-10-CM

## 2024-02-17 NOTE — Telephone Encounter (Signed)
 Medication was discontinued 10/28/2023. Is it okay to refuse refill request?

## 2024-03-07 ENCOUNTER — Other Ambulatory Visit: Payer: Self-pay | Admitting: Internal Medicine

## 2024-03-07 DIAGNOSIS — I10 Essential (primary) hypertension: Secondary | ICD-10-CM

## 2024-03-07 NOTE — Telephone Encounter (Signed)
 Medication was discontinued on 10/28/2023. Is it okay to refuse refill request?

## 2024-04-26 ENCOUNTER — Encounter: Payer: Self-pay | Admitting: Internal Medicine

## 2024-04-26 ENCOUNTER — Ambulatory Visit (INDEPENDENT_AMBULATORY_CARE_PROVIDER_SITE_OTHER): Payer: 59 | Admitting: Internal Medicine

## 2024-04-26 VITALS — BP 138/80 | HR 74 | Temp 97.9°F | Ht 77.0 in | Wt 247.2 lb

## 2024-04-26 DIAGNOSIS — Z125 Encounter for screening for malignant neoplasm of prostate: Secondary | ICD-10-CM

## 2024-04-26 DIAGNOSIS — R7301 Impaired fasting glucose: Secondary | ICD-10-CM

## 2024-04-26 DIAGNOSIS — R5383 Other fatigue: Secondary | ICD-10-CM

## 2024-04-26 DIAGNOSIS — Z0000 Encounter for general adult medical examination without abnormal findings: Secondary | ICD-10-CM

## 2024-04-26 DIAGNOSIS — E785 Hyperlipidemia, unspecified: Secondary | ICD-10-CM

## 2024-04-26 DIAGNOSIS — I10 Essential (primary) hypertension: Secondary | ICD-10-CM

## 2024-04-26 LAB — CBC WITH DIFFERENTIAL/PLATELET
Basophils Absolute: 0 K/uL (ref 0.0–0.1)
Basophils Relative: 0.5 % (ref 0.0–3.0)
Eosinophils Absolute: 0.1 K/uL (ref 0.0–0.7)
Eosinophils Relative: 1.6 % (ref 0.0–5.0)
HCT: 46.4 % (ref 39.0–52.0)
Hemoglobin: 15.5 g/dL (ref 13.0–17.0)
Lymphocytes Relative: 33.4 % (ref 12.0–46.0)
Lymphs Abs: 2 K/uL (ref 0.7–4.0)
MCHC: 33.3 g/dL (ref 30.0–36.0)
MCV: 90.1 fl (ref 78.0–100.0)
Monocytes Absolute: 0.6 K/uL (ref 0.1–1.0)
Monocytes Relative: 9.4 % (ref 3.0–12.0)
Neutro Abs: 3.3 K/uL (ref 1.4–7.7)
Neutrophils Relative %: 55.1 % (ref 43.0–77.0)
Platelets: 162 K/uL (ref 150.0–400.0)
RBC: 5.15 Mil/uL (ref 4.22–5.81)
RDW: 13.7 % (ref 11.5–15.5)
WBC: 5.9 K/uL (ref 4.0–10.5)

## 2024-04-26 LAB — COMPREHENSIVE METABOLIC PANEL WITH GFR
ALT: 40 U/L (ref 0–53)
AST: 26 U/L (ref 0–37)
Albumin: 4.5 g/dL (ref 3.5–5.2)
Alkaline Phosphatase: 56 U/L (ref 39–117)
BUN: 10 mg/dL (ref 6–23)
CO2: 30 meq/L (ref 19–32)
Calcium: 9.7 mg/dL (ref 8.4–10.5)
Chloride: 105 meq/L (ref 96–112)
Creatinine, Ser: 1.17 mg/dL (ref 0.40–1.50)
GFR: 70.54 mL/min (ref >60.00)
Glucose, Bld: 82 mg/dL (ref 70–99)
Potassium: 4.7 meq/L (ref 3.5–5.1)
Sodium: 142 meq/L (ref 135–145)
Total Bilirubin: 1.3 mg/dL — ABNORMAL HIGH (ref 0.2–1.2)
Total Protein: 6.8 g/dL (ref 6.0–8.3)

## 2024-04-26 LAB — MICROALBUMIN / CREATININE URINE RATIO
Creatinine,U: 329.2 mg/dL
Microalb Creat Ratio: 3.5 mg/g (ref 0.0–30.0)
Microalb, Ur: 1.2 mg/dL (ref 0.0–1.9)

## 2024-04-26 LAB — LDL CHOLESTEROL, DIRECT: Direct LDL: 69 mg/dL

## 2024-04-26 LAB — LIPID PANEL
Cholesterol: 134 mg/dL (ref 0–200)
HDL: 48 mg/dL
LDL Cholesterol: 74 mg/dL (ref 0–99)
NonHDL: 86.38
Total CHOL/HDL Ratio: 3
Triglycerides: 60 mg/dL (ref 0.0–149.0)
VLDL: 12 mg/dL (ref 0.0–40.0)

## 2024-04-26 LAB — TSH: TSH: 1.62 u[IU]/mL (ref 0.35–5.50)

## 2024-04-26 LAB — HEMOGLOBIN A1C: Hgb A1c MFr Bld: 5.9 % (ref 4.6–6.5)

## 2024-04-26 LAB — PSA: PSA: 1.53 ng/mL (ref 0.10–4.00)

## 2024-04-26 MED ORDER — LOSARTAN POTASSIUM 100 MG PO TABS: 100.0000 mg | ORAL_TABLET | Freq: Every day | ORAL | 1 refills | Status: AC

## 2024-04-26 MED ORDER — ROSUVASTATIN CALCIUM 40 MG PO TABS: 40.0000 mg | ORAL_TABLET | Freq: Every day | ORAL | 3 refills | Status: AC

## 2024-04-26 NOTE — Assessment & Plan Note (Signed)
 Home readings are reported as 120/70to 30/80.  Advised to continue checking BP at home so medications can be adjusted.

## 2024-04-26 NOTE — Progress Notes (Signed)
 Patient ID: Dylan Haynes, male    DOB: Apr 20, 1971  Age: 53 y.o. MRN: [id number]  The patient is here for annual preventive examination and management of other chronic and acute problems.   The risk factors are reflected in the social history.   The roster of all physicians providing medical care to patient - is listed in the Snapshot section of the chart.   Activities of daily living:  The patient is 100% independent in all ADLs: dressing, toileting, feeding as well as independent mobility   Home safety : The patient has smoke detectors in the home. They wear seatbelts.  There are no unsecured firearms at home. There is no violence in the home.    There is no risks for hepatitis, STDs or HIV. There is no   history of blood transfusion. They have no travel history to infectious disease endemic areas of the world.   The patient has seen their dentist in the last six month. They have seen their eye doctor in the last year. The patinet  denies slight hearing difficulty with regard to whispered voices and some television programs.  They have deferred audiologic testing in the last year.  They do not  have excessive sun exposure. Discussed the need for sun protection: hats, long sleeves and use of sunscreen if there is significant sun exposure.    Diet: the importance of a healthy diet is discussed. They do have a healthy diet.   The benefits of regular aerobic exercise were discussed. The patient  exercises  5 days per week  for  60 minutes.    Depression screen: there are no signs or vegative symptoms of depression- irritability, change in appetite, anhedonia, sadness/tearfullness.   The following portions of the patient's history were reviewed and updated as appropriate: allergies, current medications, past family history, past medical history,  past surgical history, past social history  and problem list.   Visual acuity was not assessed per patient preference since the patient has regular  follow up with an  ophthalmologist. Hearing and body mass index were assessed and reviewed.    During the course of the visit the patient was educated and counseled about appropriate screening and preventive services including : fall prevention , diabetes screening, nutrition counseling, colorectal cancer screening, and recommended immunizations.    Chief Complaint:  1) Hypertension: patient checks blood pressure twice weekly at home.  Readings have been for the most part <130/80 at rest . Patient is following a reduced salt diet most days and is taking medications as prescribed   Review of Symptoms  Patient denies headache, fevers, malaise, unintentional weight loss, skin rash, eye pain, sinus congestion and sinus pain, sore throat, dysphagia,  hemoptysis , cough, dyspnea, wheezing, chest pain, palpitations, orthopnea, edema, abdominal pain, nausea, melena, diarrhea, constipation, flank pain, dysuria, hematuria, urinary  Frequency, nocturia, numbness, tingling, seizures,  Focal weakness, Loss of consciousness,  Tremor, insomnia, depression, anxiety, and suicidal ideation.    Physical Exam:  BP 138/80 (BP Location: Left Arm, Patient Position: Sitting, Cuff Size: Normal)   Pulse 74   Temp 97.9 F (36.6 C) (Oral)   Ht 6' 5 (1.956 m)   Wt 247 lb 3.2 oz (112.1 kg)   SpO2 97%   BMI 29.31 kg/m    Physical Exam Vitals reviewed.  Constitutional:      General: He is not in acute distress.    Appearance: Normal appearance. He is normal weight. He is not ill-appearing, toxic-appearing or diaphoretic.  HENT:     Head: Normocephalic and atraumatic.     Right Ear: Tympanic membrane, ear canal and external ear normal. There is no impacted cerumen.     Left Ear: Tympanic membrane, ear canal and external ear normal. There is no impacted cerumen.     Nose: Nose normal.     Mouth/Throat:     Mouth: Mucous membranes are moist.     Pharynx: Oropharynx is clear.  Eyes:     General: No scleral  icterus.       Right eye: No discharge.        Left eye: No discharge.     Conjunctiva/sclera: Conjunctivae normal.  Neck:     Thyroid : No thyromegaly.     Vascular: No carotid bruit or JVD.  Cardiovascular:     Rate and Rhythm: Normal rate and regular rhythm.     Heart sounds: Normal heart sounds.  Pulmonary:     Effort: Pulmonary effort is normal. No respiratory distress.     Breath sounds: Normal breath sounds.  Abdominal:     General: Bowel sounds are normal.     Palpations: Abdomen is soft. There is no mass.     Tenderness: There is no abdominal tenderness. There is no guarding or rebound.  Musculoskeletal:        General: Normal range of motion.     Cervical back: Normal range of motion and neck supple.  Lymphadenopathy:     Cervical: No cervical adenopathy.  Skin:    General: Skin is warm and dry.  Neurological:     General: No focal deficit present.     Mental Status: He is alert and oriented to person, place, and time. Mental status is at baseline.  Psychiatric:        Mood and Affect: Mood normal.        Behavior: Behavior normal.        Thought Content: Thought content normal.        Judgment: Judgment normal.     Assessment and Plan: White coat syndrome with hypertension Assessment & Plan: Home readings are reported as 120/70to 30/80.  Advised to continue checking BP at home so medications can be adjusted.   Orders: -     Comprehensive metabolic panel with GFR -     Microalbumin / creatinine urine ratio  Hyperlipidemia LDL goal <70 Assessment & Plan: He is tolerating high  dose crestor .  LDL is not << 70   gain recommending additional treatment with Zetia     Lab Results  Component Value Date   CHOL 134 04/26/2024   HDL 48.00 04/26/2024   LDLCALC 74 04/26/2024   LDLDIRECT 69.0 04/26/2024   TRIG 60.0 04/26/2024   CHOLHDL 3 04/26/2024     Orders: -     Lipid panel -     LDL cholesterol, direct -     Rosuvastatin  Calcium ; Take 1 tablet (40 mg  total) by mouth daily.  Dispense: 90 tablet; Refill: 3  Other fatigue -     CBC with Differential/Platelet -     TSH  Prostate cancer screening -     PSA  Impaired fasting glucose -     Comprehensive metabolic panel with GFR -     Hemoglobin A1c  Hyperlipidemia LDL goal <55 Assessment & Plan: He is tolerating high  dose crestor .  LDL is not << 70   gain recommending additional treatment with Zetia     Lab Results  Component Value Date  CHOL 134 04/26/2024   HDL 48.00 04/26/2024   LDLCALC 74 04/26/2024   LDLDIRECT 69.0 04/26/2024   TRIG 60.0 04/26/2024   CHOLHDL 3 04/26/2024      Encounter for preventive health examination Assessment & Plan: age appropriate education and counseling updated, referrals for preventative services and immunizations addressed, dietary and smoking counseling addressed, most recent labs reviewed.  I have personally reviewed and have noted:   1) the patient's medical and social history 2) The pt's use of alcohol, tobacco, and illicit drugs 3) The patient's current medications and supplements 4) Functional ability including ADL's, fall risk, home safety risk, hearing and visual impairment 5) Diet and physical activities 6) Evidence for depression or mood disorder 7) The patient's height, weight, and BMI have been recorded in the chart  I have made referrals, and provided counseling and education based on review of the above    Other orders -     Losartan  Potassium; Take 1 tablet (100 mg total) by mouth daily.  Dispense: 90 tablet; Refill: 1    Return in about 6 months (around 10/27/2024).  Verneita LITTIE Kettering, MD

## 2024-04-26 NOTE — Patient Instructions (Addendum)
 1) Your blood pressure needs to be 120/80 or less MOST of the time.  Please send me home readings I a few weeks   2) Your LDL (cholesterol) needs to be 55 or less.  If today's reading is above that ,  I will ADD zetia  to your current regimen of rosuvastatin   and  repeat your lipids after 6 weeks of COMBINED therapy  3) PLEASE GO GET AN EYE EXAM.  THE PLACE [facility] EXCELL[facility].  ASK FOR KEVIN RITTER.

## 2024-04-26 NOTE — Assessment & Plan Note (Signed)
 LDL is not at goal on maximal dose of  crestor  and Zetia . I have recommended use of repatha  ; waiting for patient's reply  Lab Results  Component Value Date   CHOL 134 04/26/2024   HDL 48.00 04/26/2024   LDLCALC 74 04/26/2024   LDLDIRECT 69.0 04/26/2024   TRIG 60.0 04/26/2024   CHOLHDL 3 04/26/2024

## 2024-04-26 NOTE — Assessment & Plan Note (Signed)

## 2024-04-27 ENCOUNTER — Other Ambulatory Visit: Payer: Self-pay | Admitting: Internal Medicine

## 2024-04-27 DIAGNOSIS — E785 Hyperlipidemia, unspecified: Secondary | ICD-10-CM

## 2024-04-28 ENCOUNTER — Encounter: Payer: Self-pay | Admitting: Internal Medicine

## 2024-04-28 ENCOUNTER — Ambulatory Visit: Payer: Self-pay | Admitting: Internal Medicine

## 2024-04-28 MED ORDER — REPATHA SURECLICK 140 MG/ML ~~LOC~~ SOAJ: 140.0000 mg | SUBCUTANEOUS | 2 refills | Status: DC

## 2024-04-28 NOTE — Telephone Encounter (Signed)
 Patient called. I told him the results. Wants to get Repatha  Authorized. Wants to know it's side effects. Will it make him gain weight?

## 2024-05-02 ENCOUNTER — Other Ambulatory Visit: Payer: Self-pay | Admitting: Pharmacist

## 2024-05-02 NOTE — Progress Notes (Signed)
 Brief Telephone Documentation Reason for Call: Medication Access  Summary of Call: New Rx for: Repatha  Per Cover My Meds: [place] likely not required. Medication typically covered.   Spoke with [facility]. Reptha is current filled and ready for pickup. Cost = $25. Rx staff note this is with a savings card, not being billing through insurance.   If covered by insurance, additional copay card via Reptha website may bring cost to as little as $15/month.

## 2024-07-30 ENCOUNTER — Other Ambulatory Visit: Payer: Self-pay | Admitting: Internal Medicine

## 2024-09-06 ENCOUNTER — Other Ambulatory Visit: Payer: Self-pay | Admitting: Cardiovascular Disease

## 2024-10-27 ENCOUNTER — Encounter: Payer: Self-pay | Admitting: Internal Medicine

## 2024-10-27 ENCOUNTER — Ambulatory Visit: Payer: Self-pay | Admitting: Internal Medicine

## 2024-10-27 VITALS — BP 128/82 | HR 65 | Temp 98.1°F | Ht 77.0 in | Wt 250.8 lb

## 2024-10-27 DIAGNOSIS — Z125 Encounter for screening for malignant neoplasm of prostate: Secondary | ICD-10-CM

## 2024-10-27 DIAGNOSIS — I252 Old myocardial infarction: Secondary | ICD-10-CM

## 2024-10-27 DIAGNOSIS — M25512 Pain in left shoulder: Secondary | ICD-10-CM | POA: Diagnosis not present

## 2024-10-27 DIAGNOSIS — R7303 Prediabetes: Secondary | ICD-10-CM | POA: Diagnosis not present

## 2024-10-27 DIAGNOSIS — G8929 Other chronic pain: Secondary | ICD-10-CM | POA: Diagnosis not present

## 2024-10-27 DIAGNOSIS — I10 Essential (primary) hypertension: Secondary | ICD-10-CM

## 2024-10-27 DIAGNOSIS — E785 Hyperlipidemia, unspecified: Secondary | ICD-10-CM

## 2024-10-27 DIAGNOSIS — R7301 Impaired fasting glucose: Secondary | ICD-10-CM

## 2024-10-27 LAB — COMPREHENSIVE METABOLIC PANEL WITH GFR
ALT: 76 U/L — ABNORMAL HIGH (ref 3–53)
AST: 36 U/L (ref 5–37)
Albumin: 4.5 g/dL (ref 3.5–5.2)
Alkaline Phosphatase: 65 U/L (ref 39–117)
BUN: 11 mg/dL (ref 6–23)
CO2: 34 meq/L — ABNORMAL HIGH (ref 19–32)
Calcium: 9.5 mg/dL (ref 8.4–10.5)
Chloride: 103 meq/L (ref 96–112)
Creatinine, Ser: 1.23 mg/dL (ref 0.40–1.50)
GFR: 66.2 mL/min
Glucose, Bld: 79 mg/dL (ref 70–99)
Potassium: 4.1 meq/L (ref 3.5–5.1)
Sodium: 139 meq/L (ref 135–145)
Total Bilirubin: 1.8 mg/dL — ABNORMAL HIGH (ref 0.2–1.2)
Total Protein: 7.1 g/dL (ref 6.0–8.3)

## 2024-10-27 LAB — LIPID PANEL
Cholesterol: 106 mg/dL (ref 28–200)
HDL: 36.1 mg/dL — ABNORMAL LOW
LDL Cholesterol: 56 mg/dL (ref 10–99)
NonHDL: 69.61
Total CHOL/HDL Ratio: 3
Triglycerides: 69 mg/dL (ref 10.0–149.0)
VLDL: 13.8 mg/dL (ref 0.0–40.0)

## 2024-10-27 LAB — HEMOGLOBIN A1C: Hgb A1c MFr Bld: 6 % (ref 4.6–6.5)

## 2024-10-27 LAB — LDL CHOLESTEROL, DIRECT: Direct LDL: 62 mg/dL

## 2024-10-27 NOTE — Patient Instructions (Addendum)
 Orthopedic referral to [facility] in progress for your shoulder   You can start treatimg bursitis  with the following regimen:  2 aleve plus  1000 mg tylenol  every 12 hours   Stretch when muscles are warmed up. No  sudden movement    You will likely need to restart Repatha  and STAY ON IT ALONG WITH CRESTOR  AND ZETIA  TO KEEP YOUR LDL < 55   BP GOAL IS  130/80 ON MOST READINGS

## 2024-10-27 NOTE — Progress Notes (Signed)
 "  Subjective:  Patient ID: Dylan Haynes, male    DOB: 1971-05-21  Age: 54 y.o. MRN: [id number]  CC: The primary encounter diagnosis was Chronic left shoulder pain. Diagnoses of White coat syndrome with hypertension, Impaired fasting glucose, Hyperlipidemia LDL goal <70, Hyperlipidemia LDL goal <55, Prediabetes, Prostate cancer screening, and History of non-ST elevation myocardial infarction (NSTEMI) were also pertinent to this visit.   HPI Victormanuel B Amos presents for  Chief Complaint  Patient presents with   Annual Exam    6 month   1) CAD: taking coreg , osarta zeta and cresotor.  ran out of repatha  several months ago and has not refilled it.    2) left shoulder pain in the A/C joint  for several  months . Pain started after extending left arm carrying 40 lbs.     3) Hypertension: patient checks blood pressure monthly  at home.  Readings have been for the most part <130/80 at rest . Patient is following a reduced salt diet most days and is taking medications as prescribed   Outpatient Medications Prior to Visit  Medication Sig Dispense Refill   ASPIRIN  LOW DOSE 81 MG chewable tablet CHEW 1 TABLET BY MOUTH EVERY DAY 90 tablet 1   carvedilol  (COREG ) 12.5 MG tablet Take 1 tablet (12.5 mg total) by mouth 2 (two) times daily with a meal. Pt must keep scheduled followup appt with Cardiology in February 2026 for any more refills. Thank You 180 tablet 0   Evolocumab  (REPATHA  SURECLICK) 140 MG/ML SOAJ INJECT 140 MG INTO THE SKIN EVERY 14 (FOURTEEN) DAYS. 6 mL 1   ezetimibe  (ZETIA ) 10 MG tablet Take 1 tablet (10 mg total) by mouth daily. 90 tablet 3   losartan  (COZAAR ) 100 MG tablet Take 1 tablet (100 mg total) by mouth daily. 90 tablet 1   rosuvastatin  (CRESTOR ) 40 MG tablet Take 1 tablet (40 mg total) by mouth daily. 90 tablet 3   ondansetron  (ZOFRAN -ODT) 4 MG disintegrating tablet Take 1 tablet (4 mg total) by mouth every 8 (eight) hours as needed for nausea or vomiting. (Patient not  taking: Reported on 10/27/2024) 20 tablet 0   No facility-administered medications prior to visit.    Review of Systems;  Patient denies headache, fevers, malaise, unintentional weight loss, skin rash, eye pain, sinus congestion and sinus pain, sore throat, dysphagia,  hemoptysis , cough, dyspnea, wheezing, chest pain, palpitations, orthopnea, edema, abdominal pain, nausea, melena, diarrhea, constipation, flank pain, dysuria, hematuria, urinary  Frequency, nocturia, numbness, tingling, seizures,  Focal weakness, Loss of consciousness,  Tremor, insomnia, depression, anxiety, and suicidal ideation.      Objective:  BP 128/82 (BP Location: Left Arm)   Pulse 65   Temp 98.1 F (36.7 C)   Ht 6' 5 (1.956 m)   Wt 250 lb 12.8 oz (113.8 kg)   SpO2 97%   BMI 29.74 kg/m   BP Readings from Last 3 Encounters:  10/27/24 128/82  04/26/24 138/80  10/31/23 120/70    Wt Readings from Last 3 Encounters:  10/27/24 250 lb 12.8 oz (113.8 kg)  04/26/24 247 lb 3.2 oz (112.1 kg)  10/31/23 246 lb (111.6 kg)    Physical Exam Vitals reviewed.  Constitutional:      General: He is not in acute distress.    Appearance: Normal appearance. He is normal weight. He is not ill-appearing, toxic-appearing or diaphoretic.  HENT:     Head: Normocephalic.  Eyes:     General: No scleral icterus.  Right eye: No discharge.        Left eye: No discharge.     Conjunctiva/sclera: Conjunctivae normal.  Cardiovascular:     Rate and Rhythm: Normal rate and regular rhythm.     Heart sounds: Normal heart sounds.  Pulmonary:     Effort: Pulmonary effort is normal. No respiratory distress.     Breath sounds: Normal breath sounds.  Musculoskeletal:     Left shoulder: Tenderness and bony tenderness present. Decreased range of motion.       Arms:     Cervical back: Normal range of motion.  Skin:    General: Skin is warm and dry.  Neurological:     General: No focal deficit present.     Mental Status: He is  alert and oriented to person, place, and time. Mental status is at baseline.  Psychiatric:        Mood and Affect: Mood normal.        Behavior: Behavior normal.        Thought Content: Thought content normal.        Judgment: Judgment normal.     Lab Results  Component Value Date   HGBA1C 6.0 10/27/2024   HGBA1C 5.9 04/26/2024   HGBA1C 5.8 04/23/2023    Lab Results  Component Value Date   CREATININE 1.23 10/27/2024   CREATININE 1.17 04/26/2024   CREATININE 1.26 10/28/2023    Lab Results  Component Value Date   WBC 5.9 04/26/2024   HGB 15.5 04/26/2024   HCT 46.4 04/26/2024   PLT 162.0 04/26/2024   GLUCOSE 79 10/27/2024   CHOL 106 10/27/2024   TRIG 69.0 10/27/2024   HDL 36.10 (L) 10/27/2024   LDLDIRECT 62.0 10/27/2024   LDLCALC 56 10/27/2024   ALT 76 (H) 10/27/2024   AST 36 10/27/2024   NA 139 10/27/2024   K 4.1 10/27/2024   CL 103 10/27/2024   CREATININE 1.23 10/27/2024   BUN 11 10/27/2024   CO2 34 (H) 10/27/2024   TSH 1.62 04/26/2024   PSA 1.53 04/26/2024   HGBA1C 6.0 10/27/2024   MICROALBUR 1.2 04/26/2024    DG Wrist Complete Right Result Date: 01/13/2022 CLINICAL DATA:  Trauma the right wrist. EXAM: RIGHT WRIST - COMPLETE 3+ VIEW COMPARISON:  None. FINDINGS: There is no evidence of fracture or dislocation. There is no evidence of arthropathy or other focal bone abnormality. Soft tissues are unremarkable. IMPRESSION: Negative. Electronically Signed   By: Vanetta Chou M.D.   On: 01/13/2022 20:21    Assessment & Plan:   Problem List Items Addressed This Visit     Chronic left shoulder pain - Primary   A/c joint point tenderness on exam with restricted adduction of arm.  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  S/p stent with resolution to TIMI 0 .   Has fiished use of  Brilinta  for one year,  continues to take  Statin , ARB and beta blocker .  EF normal,  No WMA,  And   LV hypertrophy with Grade I  diastolic dysfunction noted on ECHO.  REMINDED TO   continue Follow up with cardiology every 6 months      Hyperlipidemia LDL goal <55   Previously managed with Repatha ,  crestor  and Zetia ,  but has  not taken repatha  in several months.  LDL is 56 owithout repatha   Lab Results  Component Value Date   CHOL 106 10/27/2024   HDL 36.10 (L) 10/27/2024   LDLCALC 56 10/27/2024   LDLDIRECT 62.0 10/27/2024   TRIG 69.0 10/27/2024   CHOLHDL 3 10/27/2024         Relevant Orders   Lipid panel   Direct LDL   White coat syndrome with hypertension   Home readings are reported as 120/70 to 130/80 on carvedilol  and losartan   Advised to continue checking BP at home so medications can be adjusted.   Lab Results  Component Value Date   NA 139 10/27/2024   K 4.1 10/27/2024   CL 103 10/27/2024   CO2 34 (H) 10/27/2024   Lab Results  Component Value Date   CREATININE 1.23 10/27/2024    Lab Results  Component Value Date   MICROALBUR 1.2 04/26/2024           Relevant Orders   Comp Met (CMET) (Completed)   Comprehensive metabolic panel with GFR   Other Visit Diagnoses       Impaired fasting glucose       Relevant Orders   Comp Met (CMET) (Completed)     Hyperlipidemia LDL goal <70       Relevant Orders   Direct LDL (Completed)   Lipid Profile (Completed)     Prediabetes       Relevant Orders   Hemoglobin A1c (Completed)     Prostate cancer screening       Relevant Orders   PSA         Follow-up: Return in about 6 months (around 04/26/2025) for physical.   Verneita LITTIE Kettering, MD "

## 2024-10-27 NOTE — Assessment & Plan Note (Addendum)
 Previously managed with Repatha ,  crestor  and Zetia ,  but has not taken repatha  in several months.  LDL is 56 owithout repatha   Lab Results  Component Value Date   CHOL 106 10/27/2024   HDL 36.10 (L) 10/27/2024   LDLCALC 56 10/27/2024   LDLDIRECT 62.0 10/27/2024   TRIG 69.0 10/27/2024   CHOLHDL 3 10/27/2024

## 2024-10-28 ENCOUNTER — Ambulatory Visit: Attending: Cardiovascular Disease | Admitting: Cardiovascular Disease

## 2024-10-28 ENCOUNTER — Encounter: Payer: Self-pay | Admitting: Cardiovascular Disease

## 2024-10-28 VITALS — BP 138/86 | HR 91 | Ht 77.0 in | Wt 257.0 lb

## 2024-10-28 DIAGNOSIS — I10 Essential (primary) hypertension: Secondary | ICD-10-CM

## 2024-10-28 DIAGNOSIS — R7303 Prediabetes: Secondary | ICD-10-CM | POA: Diagnosis not present

## 2024-10-28 DIAGNOSIS — I2510 Atherosclerotic heart disease of native coronary artery without angina pectoris: Secondary | ICD-10-CM

## 2024-10-28 DIAGNOSIS — E785 Hyperlipidemia, unspecified: Secondary | ICD-10-CM | POA: Diagnosis not present

## 2024-10-28 DIAGNOSIS — G8929 Other chronic pain: Secondary | ICD-10-CM | POA: Insufficient documentation

## 2024-10-28 NOTE — Progress Notes (Signed)
 "    Cardiology office note    Date:  10/28/2024   ID:  Anel, Purohit [birth year], MRN [id number]  PCP:  Marylynn Verneita CROME, MD  Cardiologist:  Jerel Balding, MD  Electrophysiologist:  None   Evaluation Performed:  Follow-Up Visit  Chief Complaint:  F/U CAD  History of Present Illness:    Dylan Haynes is a 54 y.o. male with hypercholesterolemia and essential hypertension who presented with non-STEMI due to occlusion of the distal right coronary artery in April 2020 treated with urgent PCI (2.5 x 12 mm resolute DES).  He also had evidence of mild stenoses in the mid RCA and right posterior lateral ventricular branch, but no significant lesions in the left coronary system.  The echocardiogram performed the day following his infarction showed normal LVEF and wall motion with moderate LVH.  There was evidence of moderate diastolic dysfunction both by LVEDP measurement and echo parameters.  He continues to do well and has no symptoms of active cardiac illness.  He works in a programme researcher, broadcasting/film/video and walks [phone] steps every day, 5 days a week.  He commutes to [facility] and has 6 boys ages 3-22, so he does not have much time for exercise.    The patient specifically denies any chest pain at rest or with exertion, dyspnea at rest or with exertion, orthopnea, paroxysmal nocturnal dyspnea, syncope, palpitations, focal neurological deficits, intermittent claudication, lower extremity edema, unexplained weight gain, cough, hemoptysis or wheezing.  He is on aggressive lipid-lowering therapy with Repatha  plus maximum dose rosuvastatin  plus ezetimibe .  Direct LDL cholesterol is close to target range at 62 (direct measurement), 56 (calculated).  He showed great discipline with his diet and exercise immediately after the heart attack in his hemoglobin A1c was in completely normal range as low as 5.5 and his HDL had increased to over 40.  However he slowly gained back some weight and his hemoglobin A1c is back  in prediabetes range at 6.0% and his HDL is now 36.  With weight gain he is now back in mildly obese range with a BMI of 30.5.  This overestimates his true level of obesity however, since he is also quite muscular.  He remains overweight with a BMI around 29, although he is also very fit and muscular.  Has some signs of insulin resistance including prediabetes (hemoglobin A1c 5.8%) and a relatively low HDL cholesterol (43).  LDL is borderline adequate at 72 (it was 61 in ago).   Past Medical History:  Diagnosis Date   Hypertension    Past Surgical History:  Procedure Laterality Date   CORONARY STENT INTERVENTION N/A 12/29/2018   Procedure: CORONARY STENT INTERVENTION;  Surgeon: Burnard Debby LABOR, MD;  Location: [facility] INVAS[facility];  Service: Cardiovascular;  Laterality: N/A;   cyst removal in back     LEFT HEART CATH AND CORONARY ANGIOGRAPHY N/A 12/29/2018   Procedure: LEFT HEART CATH AND CORONARY ANGIOGRAPHY;  Surgeon: Burnard Debby LABOR, MD;  Location: [facility] INVAS[facility] CV LAB;  Service: Cardiovascular;  Laterality: N/A;     Current Meds  Medication Sig   ASPIRIN  LOW DOSE 81 MG chewable tablet CHEW 1 TABLET BY MOUTH EVERY DAY   carvedilol  (COREG ) 12.5 MG tablet Take 1 tablet (12.5 mg total) by mouth 2 (two) times daily with a meal. Pt must keep scheduled followup appt with Cardiology in February 2026 for any more refills. Thank You   Evolocumab  (REPATHA  SURECLICK) 140 MG/ML SOAJ INJECT 140 MG INTO THE  SKIN EVERY 14 (FOURTEEN) DAYS.   ezetimibe  (ZETIA ) 10 MG tablet Take 1 tablet (10 mg total) by mouth daily.   losartan  (COZAAR ) 100 MG tablet Take 1 tablet (100 mg total) by mouth daily.   rosuvastatin  (CRESTOR ) 40 MG tablet Take 1 tablet (40 mg total) by mouth daily.     Allergies:   Patient has no known allergies.   Social History   Tobacco Use   Smoking status: Former    Types: Cigars   Smokeless tobacco: Never  Substance Use Topics   Alcohol use: No   Drug use: No     Family Hx: The  patient's family history includes Cancer (age of onset: 55) in his maternal grandmother; Heart disease in his paternal uncle; Hypertension in his mother; Stroke in his mother. There is no history of Drug abuse. He was adopted.  ROS:   Please see the history of present illness.    All other systems reviewed and are negative.   Prior CV studies:   The following studies were reviewed today:  Labs/Other Tests and Data Reviewed:    EKG:   EKG Interpretation Date/Time:  Friday October 28 2024 16:45:34 EST Ventricular Rate:  80 PR Interval:  146 QRS Duration:  78 QT Interval:  350 QTC Calculation: 403 R Axis:   42  Text Interpretation: Normal sinus rhythm Nonspecific T wave abnormality When compared with ECG of 24-Aug-2023 10:39, No significant change was found Confirmed by Tank Difiore ([id number]) on 10/28/2024 4:47:51 PM          Cardiac catheterization on 12/29/2018  Mid RCA lesion is 20% stenosed. Post Atrio lesion is 100% stenosed. Post intervention, there is a 0% residual stenosis. 1st RPLB lesion is 30% stenosed. There is mild left ventricular systolic dysfunction. LV end diastolic pressure is mildly elevated. There is no mitral valve regurgitation. A stent was successfully placed.   Acute coronary syndrome secondary to total occlusion of the distal RCA immediately after a small PDA vessel with initial TIMI 0 flow.  There was  faint left to right collateralization dominantly from the LAD.   Normal left coronary circulation with a large LAD, and left circumflex coronary artery giving rise to a large bifurcating OM1 branch, a diminutive OM 2 vessel distal vessel supplying a portion of the posterior lateral wall.   Mild acute LV dysfunction with an EF of 50% with mild mid inferior hypocontractility.  LVEDP mildly increased at 20 mm.   Successful PCI to the totally occluded RCA with insertion of a 2.5 x 12 mm Resolute DES stent postdilated to 3.7 mm with 100% occlusion being  reduced to 0%.  There was mild haziness at the ostium of the inferolateral branch suggesting mild thrombus and there was brisk TIMI-3 flow down the large PLA vessel.  Aggrastat  bolus plus infusion was administered with plans to continue Aggrastat  for 18 hours post PCI.   RECOMMENDATION: DAPT for minimum of 1 year.  Aggressive lipid-lowering therapy with consideration for PCSK9 inhibition in addition to high potency statin/ezetamide with target LDL less than 70.  Optimal blood pressure control with ideal blood pressure less than 120/80.  Diagnostic Dominance: Right  Intervention    Implants   Permanent Stent  Stent Resolute Onyx 2.5x12       ECHOCARDIOGRAM 12/30/2018   1. The left ventricle has normal systolic function with an ejection fraction of 60-65%. The cavity size was normal. There is moderately increased left ventricular wall thickness. Left ventricular diastolic Doppler parameters are  consistent with impaired  relaxation. Indeterminate filling pressures The E/e' is 8-15. No evidence of left ventricular regional wall motion abnormalities.  2. The right ventricle has normal systolic function. The cavity was normal. There is no increase in right ventricular wall thickness.  3. The mitral valve is grossly normal.  4. The tricuspid valve is grossly normal.  5. The aortic valve is tricuspid.  6. The aortic root and ascending aorta are normal in size and structure.    Recent Labs: 04/26/2024: Hemoglobin 15.5; Platelets 162.0; TSH 1.62 10/27/2024: ALT 76; BUN 11; Creatinine, Ser 1.23; Potassium 4.1; Sodium 139   Recent Lipid Panel Lab Results  Component Value Date/Time   CHOL 106 10/27/2024 10:07 AM   CHOL 103 02/04/2019 09:25 AM   TRIG 69.0 10/27/2024 10:07 AM   HDL 36.10 (L) 10/27/2024 10:07 AM   HDL 36 (L) 02/04/2019 09:25 AM   CHOLHDL 3 10/27/2024 10:07 AM   LDLCALC 56 10/27/2024 10:07 AM   LDLCALC 62 04/21/2022 10:27 AM   LDLDIRECT 62.0 10/27/2024 10:07 AM    Wt  Readings from Last 3 Encounters:  10/28/24 257 lb (116.6 kg)  10/27/24 250 lb 12.8 oz (113.8 kg)  04/26/24 247 lb 3.2 oz (112.1 kg)          Objective:    Vital Signs:  BP 138/86   Pulse 91   Ht 6' 5 (1.956 m)   Wt 257 lb (116.6 kg)   SpO2 97%   BMI 30.48 kg/m     General: Alert, oriented x3, no distress, but also appears muscular and fit Head: no evidence of trauma, PERRL, EOMI, no exophtalmos or lid lag, no myxedema, no xanthelasma; normal ears, nose and oropharynx.  Frank's sign is present bilaterally Neck: normal jugular venous pulsations and no hepatojugular reflux; brisk carotid pulses without delay and no carotid bruits Chest: clear to auscultation, no signs of consolidation by percussion or palpation, normal fremitus, symmetrical and full respiratory excursions Cardiovascular: normal position and quality of the apical impulse, regular rhythm, normal first and second heart sounds, no murmurs, rubs or gallops Abdomen: no tenderness or distention, no masses by palpation, no abnormal pulsatility or arterial bruits, normal bowel sounds, no hepatosplenomegaly Extremities: no clubbing, cyanosis or edema; 2+ radial, ulnar and brachial pulses bilaterally; 2+ right femoral, posterior tibial and dorsalis pedis pulses; 2+ left femoral, posterior tibial and dorsalis pedis pulses; no subclavian or femoral bruits Neurological: grossly nonfocal Psych: Normal mood and affect    ASSESSMENT & PLAN:    1. Coronary artery disease involving native coronary artery of native heart without angina pectoris   2. Essential hypertension   3. Dyslipidemia (high LDL; low HDL)   4. Prediabetes       CAD: Asymptomatic.  On aspirin , high-dose statin, beta-blocker. HTN: He always tends to run a rather high diastolic blood pressure.  No changes made to his medications today. HLP/prediabetes: Unfortunately his HDL cholesterol has deteriorated with weight gain, but he has an excellent LDL cholesterol  and a combination of Repatha , maximum dose rosuvastatin  and ezetimibe .  Encouraged reducing his intake of sweets and starchy foods and trying to lose the weight that he has gained.  This should also help improve his blood pressure, which is now borderline elevated.  Patient Instructions  Medication Instructions:  No changes *If you need a refill on your cardiac medications before your next appointment, please call your pharmacy*  Lab Work: None ordered If you have labs (blood work) drawn today and your  tests are completely normal, you will receive your results only by: MyChart Message (if you have MyChart) OR A paper copy in the mail If you have any lab test that is abnormal or we need to change your treatment, we will call you to review the results.  Testing/Procedures: None ordered  Follow-Up: At [facility], you and your health needs are our priority.  As part of our continuing mission to provide you with exceptional heart care, our providers are all part of one team.  This team includes your primary Cardiologist (physician) and Advanced Practice Providers or APPs (Physician Assistants and Nurse Practitioners) who all work together to provide you with the care you need, when you need it.  Your next appointment:   1 year(s)  Provider:   Jerel Balding, MD    We recommend signing up for the patient portal called MyChart.  Sign up information is provided on this After Visit Summary.  MyChart is used to connect with patients for Virtual Visits (Telemedicine).  Patients are able to view lab/test results, encounter notes, upcoming appointments, etc.  Non-urgent messages can be sent to your provider as well.   To learn more about what you can do with MyChart, go to [email or web address].          Signed, Jerel Balding, MD  10/28/2024 5:44 PM    [facility] "

## 2024-10-28 NOTE — Assessment & Plan Note (Signed)
 Home readings are reported as 120/70 to 130/80 on carvedilol  and losartan   Advised to continue checking BP at home so medications can be adjusted.   Lab Results  Component Value Date   NA 139 10/27/2024   K 4.1 10/27/2024   CL 103 10/27/2024   CO2 34 (H) 10/27/2024   Lab Results  Component Value Date   CREATININE 1.23 10/27/2024    Lab Results  Component Value Date   MICROALBUR 1.2 04/26/2024

## 2024-10-28 NOTE — Assessment & Plan Note (Signed)
 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  S/p stent with resolution to TIMI 0 .  Has fiished use of  Brilinta  for one year,  continues to take  Statin , ARB and beta blocker .  EF normal,  No WMA,  And   LV hypertrophy with Grade I  diastolic dysfunction noted on ECHO.  REMINDED TO   continue Follow up with cardiology every 6 months

## 2024-10-28 NOTE — Patient Instructions (Signed)

## 2024-10-30 ENCOUNTER — Ambulatory Visit: Payer: Self-pay | Admitting: Internal Medicine

## 2024-10-30 DIAGNOSIS — R748 Abnormal levels of other serum enzymes: Secondary | ICD-10-CM

## 2024-10-30 NOTE — Progress Notes (Signed)
 Youe labs are all normal with the exception of a new, slight elevation  of your liver enzymes (ALT).  Please plan to return for a lab visit in 3-4 weeks to have it rechecked.  Regards,   Verneita Kettering, MD

## 2024-11-15 ENCOUNTER — Ambulatory Visit: Payer: Self-pay | Admitting: Cardiovascular Disease

## 2024-11-17 ENCOUNTER — Other Ambulatory Visit

## 2025-05-01 ENCOUNTER — Other Ambulatory Visit

## 2025-05-03 ENCOUNTER — Encounter: Admitting: Internal Medicine
